# Patient Record
Sex: Female | Born: 1995 | Hispanic: No | Marital: Single | Smoking: Current every day smoker
Health system: Southern US, Community
[De-identification: ages and names within clinical notes are randomized; demographics above are authoritative.]

---

## 2017-04-06 DIAGNOSIS — D1621 Benign neoplasm of long bones of right lower limb: Secondary | ICD-10-CM | POA: Insufficient documentation

## 2017-08-15 ENCOUNTER — Encounter (HOSPITAL_COMMUNITY): Payer: Self-pay

## 2017-08-15 ENCOUNTER — Emergency Department (HOSPITAL_COMMUNITY)
Admission: EM | Admit: 2017-08-15 | Discharge: 2017-08-15 | Disposition: A | Discharge status: Home / Self Care | Payer: Medicaid Other | Attending: Emergency Medicine | Admitting: Emergency Medicine

## 2017-08-15 DIAGNOSIS — Z3201 Encounter for pregnancy test, result positive: Secondary | ICD-10-CM | POA: Insufficient documentation

## 2017-08-15 DIAGNOSIS — O99330 Smoking (tobacco) complicating pregnancy, unspecified trimester: Secondary | ICD-10-CM | POA: Diagnosis not present

## 2017-08-15 DIAGNOSIS — N898 Other specified noninflammatory disorders of vagina: Secondary | ICD-10-CM | POA: Diagnosis not present

## 2017-08-15 DIAGNOSIS — M545 Low back pain, unspecified: Secondary | ICD-10-CM

## 2017-08-15 DIAGNOSIS — F17210 Nicotine dependence, cigarettes, uncomplicated: Secondary | ICD-10-CM | POA: Insufficient documentation

## 2017-08-15 DIAGNOSIS — Z79899 Other long term (current) drug therapy: Secondary | ICD-10-CM | POA: Diagnosis not present

## 2017-08-15 DIAGNOSIS — Z3490 Encounter for supervision of normal pregnancy, unspecified, unspecified trimester: Secondary | ICD-10-CM

## 2017-08-15 DIAGNOSIS — O9989 Other specified diseases and conditions complicating pregnancy, childbirth and the puerperium: Secondary | ICD-10-CM | POA: Diagnosis not present

## 2017-08-15 DIAGNOSIS — Z3A00 Weeks of gestation of pregnancy not specified: Secondary | ICD-10-CM | POA: Diagnosis not present

## 2017-08-15 LAB — URINALYSIS, ROUTINE W REFLEX MICROSCOPIC
BILIRUBIN URINE: NEGATIVE
Bacteria, UA: NONE SEEN
Glucose, UA: NEGATIVE mg/dL
Hgb urine dipstick: NEGATIVE
KETONES UR: 80 mg/dL — AB
Nitrite: NEGATIVE
PH: 5 (ref 5.0–8.0)
PROTEIN: 30 mg/dL — AB
Specific Gravity, Urine: 1.035 — ABNORMAL HIGH (ref 1.005–1.030)

## 2017-08-15 LAB — I-STAT BETA HCG BLOOD, ED (MC, WL, AP ONLY)

## 2017-08-15 LAB — WET PREP, GENITAL
CLUE CELLS WET PREP: NONE SEEN
Sperm: NONE SEEN
Trich, Wet Prep: NONE SEEN
Yeast Wet Prep HPF POC: NONE SEEN

## 2017-08-15 LAB — PREGNANCY, URINE: Preg Test, Ur: POSITIVE — AB

## 2017-08-15 MED ORDER — ACETAMINOPHEN 325 MG PO TABS
650.0000 mg | ORAL_TABLET | Freq: Once | ORAL | Status: AC
  Administered 2017-08-15: 650 mg via ORAL
  Filled 2017-08-15: qty 2

## 2017-08-15 MED ORDER — SODIUM CHLORIDE 0.9 % IV BOLUS (SEPSIS)
1000.0000 mL | Freq: Once | INTRAVENOUS | Status: AC
  Administered 2017-08-15: 1000 mL via INTRAVENOUS

## 2017-08-15 MED ORDER — DEXTROSE 5 % IV SOLN
1.0000 g | Freq: Once | INTRAVENOUS | Status: AC
  Administered 2017-08-15: 1 g via INTRAVENOUS
  Filled 2017-08-15: qty 10

## 2017-08-15 MED ORDER — CEPHALEXIN 500 MG PO CAPS: 500.0000 mg | ORAL_CAPSULE | Freq: Two times a day (BID) | ORAL | 0 refills | Status: DC

## 2017-08-15 NOTE — Discharge Instructions (Signed)
Follow with OB/GYN as soon as possible. Do not hesitate to return to the emergency department for any new, worsening or concerning symptoms.  Do NOT take any NSAIDs, such as Aspirin, Motrin, Ibuprofen, Aleve, Naproxen etc. Only take Tylenol for pain. Return to the emergency room  for any severe abdominal pain, increasing vaginal bleeding, passing out or repeated vomiting.   Obtain over-the-counter prenatal vitamins. Read the label and make sure that they have at least 400 mcg of folate acid.  Please try to quit smoking.  If you develop vaginal bleeding, or abdominal pain go to women's hospital directly for evaluation.  Please go to the [facility] office in [facility] to apply for coverage. Alternatively, you can could go to the [place] office in [facility] to apply for emergency coverage.

## 2017-08-15 NOTE — ED Notes (Signed)
Pt called, no response

## 2017-08-15 NOTE — ED Triage Notes (Signed)
Patient c/o bilateral low back pain x 2 months. Patient reports frequent UTI's. Patient states she has been on antibiotics, but was never called regarding urine culture. Low back pain continues.

## 2017-08-15 NOTE — ED Provider Notes (Signed)
[place] DEPT Provider Note   CSN: [id number] Arrival date & time: 08/15/17  1805     History   Chief Complaint Chief Complaint  Patient presents with  . Back Pain  . Nausea    HPI   Blood pressure 116/63, pulse 71, temperature 98.3 F (36.8 C), temperature source Oral, resp. rate 16, height 5\' 6"  (1.676 m), weight 79.4 kg (175 lb), last menstrual period 07/13/2017, SpO2 99 %.  Miranda Fischer is a 21 y.o. female complaining of nonradiating low back pain which she's had over the course of 2 months. She denies any flank pain, fever, chills, nausea vomiting. She states that she's had intermittent urinary tract infections with urinary frequency and dysuria and she states she's been on 3 antibiotic courses with little relief. She's been getting her care out of urgent cares. She denies abdominal pain but endorses an abnormal vaginal discharge over the last 7 days with nausea and no emesis. She is 2 days late for her menses. Patient reports she's been taking ibuprofen at home with little relief. She rates the back pain at 8 out of 10, no exacerbating or alleviating factors identified. She denies any history of IV drug use or cancer. She states that the back pain has been becoming progressively worse over the course of 2 months. No acute change in symptoms recently.  History reviewed. No pertinent past medical history.  There are no active problems to display for this patient.   History reviewed. No pertinent surgical history.  OB History    No data available       Home Medications    Prior to Admission medications   Medication Sig Start Date End Date Taking? Authorizing Provider  ibuprofen (ADVIL,MOTRIN) 200 MG tablet Take 600 mg by mouth every 6 (six) hours as needed (back pain).   Yes [provider]  cephALEXin (KEFLEX) 500 MG capsule Take 1 capsule (500 mg total) by mouth 2 (two) times daily. 08/15/17   Daron Breeding, Elmyra Ricks, PA-C     Family History Family History  Problem Relation Age of Onset  . Family history unknown: Yes    Social History Social History  Substance Use Topics  . Smoking status: Current Every Day Smoker    Types: Cigars  . Smokeless tobacco: Never Used  . Alcohol use Yes     Comment: occasionally     Allergies   Penicillins   Review of Systems Review of Systems  A complete review of systems was obtained and all systems are negative except as noted in the HPI and PMH.   Physical Exam Updated Vital Signs BP 116/63 (BP Location: Right Arm)   Pulse 71   Temp 98.3 F (36.8 C) (Oral)   Resp 16   Ht 5\' 6"  (1.676 m)   Wt 79.4 kg (175 lb)   LMP 07/13/2017 (Approximate)   SpO2 99%   BMI 28.25 kg/m   Physical Exam  Constitutional: She is oriented to person, place, and time. She appears well-developed and well-nourished. No distress.  HENT:  Head: Normocephalic.  Mouth/Throat: Oropharynx is clear and moist.  Eyes: Conjunctivae and EOM are normal.  Neck: Normal range of motion.  Cardiovascular: Normal rate.   Pulmonary/Chest: Effort normal. No stridor.  Abdominal: Soft. Bowel sounds are normal. She exhibits no distension and no mass. There is no tenderness. There is no rebound and no guarding. No hernia.  Genitourinary:  Genitourinary Comments: No CVA tenderness to percussion bilaterally  Pelvic exam is chaperoned  by nurse: No rashes or lesions, there is a scant, white, opaque, homogenous, non-foul-smelling vaginal discharge with no cervical motion or adnexal tenderness.  Musculoskeletal: Normal range of motion.  Neurological: She is alert and oriented to person, place, and time.  Psychiatric: She has a normal mood and affect.  Nursing note and vitals reviewed.    ED Treatments / Results  Labs (all labs ordered are listed, but only abnormal results are displayed) Labs Reviewed  WET PREP, GENITAL - Abnormal; Notable for the following:       Result Value   WBC, Wet Prep  HPF POC MANY (*)    All other components within normal limits  URINALYSIS, ROUTINE W REFLEX MICROSCOPIC - Abnormal; Notable for the following:    Specific Gravity, Urine 1.035 (*)    Ketones, ur 80 (*)    Protein, ur 30 (*)    Leukocytes, UA TRACE (*)    Squamous Epithelial / LPF 0-5 (*)    All other components within normal limits  PREGNANCY, URINE - Abnormal; Notable for the following:    Preg Test, Ur POSITIVE (*)    All other components within normal limits  I-STAT BETA HCG BLOOD, ED ([facility], [facility], [facility] ONLY) - Abnormal; Notable for the following:    I-stat hCG, quantitative >2,000.0 (*)    All other components within normal limits  URINE CULTURE  RPR  HIV ANTIBODY (ROUTINE TESTING)  HCG, QUANTITATIVE, PREGNANCY  GC/CHLAMYDIA PROBE AMP ([place]) NOT AT [facility]    EKG  EKG Interpretation None       Radiology No results found.  Procedures Procedures (including critical care time)  Medications Ordered in ED Medications  cefTRIAXone (ROCEPHIN) 1 g in dextrose 5 % 50 mL IVPB (not administered)  acetaminophen (TYLENOL) tablet 650 mg (650 mg Oral Given 08/15/17 2058)  sodium chloride 0.9 % bolus 1,000 mL (1,000 mLs Intravenous New Bag/Given 08/15/17 2057)     Initial Impression / Assessment and Plan / ED Course  I have reviewed the triage vital signs and the nursing notes.  Pertinent labs & imaging results that were available during my care of the patient were reviewed by me and considered in my medical decision making (see chart for details).     Vitals:   08/15/17 1833 08/15/17 1835  BP: 116/63   Pulse: 71   Resp: 16   Temp: 98.3 F (36.8 C)   TempSrc: Oral   SpO2: 99%   Weight:  79.4 kg (175 lb)  Height:  5\' 6"  (1.676 m)    Medications  cefTRIAXone (ROCEPHIN) 1 g in dextrose 5 % 50 mL IVPB (not administered)  acetaminophen (TYLENOL) tablet 650 mg (650 mg Oral Given 08/15/17 2058)  sodium chloride 0.9 % bolus 1,000 mL (1,000 mLs Intravenous New Bag/Given  08/15/17 2057)    Miranda Fischer is 21 y.o. female presenting with low back pain, nausea and vaginal discharge. This pain has been indolent and worsening over the last 2 months. Urinalysis with leukocytes, not convincing for UTI. Urine pregnancy positive, she is 2 days late on her period. Abdominal exam is benign., Vital signs reassuring, doubt ectopic. Will confirm via i-STAT hCG. Urinalysis with greater than 80 ketones, will also give fluid bolus.  I stat hCG positive, pelvic exam with no significant abnormality, repeat abdominal exam remains completely benign. Advised patient on smoking cessation, will draw a quantitative for comparison in 48 hours, she understands she needs to go to [facility] hospital for recheck. Out of an abundance  of caution will treat UTI with Keflex. Patient given Rocephin in the ED. Advised her not to take any NSAIDs. Recommend Tylenol for chronic back pain.  Evaluation does not show pathology that would require ongoing emergent intervention or inpatient treatment. Pt is hemodynamically stable and mentating appropriately. Discussed findings and plan with patient/guardian, who agrees with care plan. All questions answered. Return precautions discussed and outpatient follow up given.    Final Clinical Impressions(s) / ED Diagnoses   Final diagnoses:  Midline low back pain without sciatica, unspecified chronicity  Pregnancy, unspecified gestational age    New Prescriptions New Prescriptions   CEPHALEXIN (KEFLEX) 500 MG CAPSULE    Take 1 capsule (500 mg total) by mouth 2 (two) times daily.     Waynetta Pean 08/15/17 2246    Dorie Rank, MD 08/16/17 [phone]

## 2017-08-16 LAB — HIV ANTIBODY (ROUTINE TESTING W REFLEX): HIV Screen 4th Generation wRfx: NONREACTIVE

## 2017-08-16 LAB — HCG, QUANTITATIVE, PREGNANCY: HCG, BETA CHAIN, QUANT, S: 21151 m[IU]/mL — AB (ref <5)

## 2017-08-17 LAB — URINE CULTURE: Culture: NO GROWTH

## 2017-08-17 LAB — GC/CHLAMYDIA PROBE AMP (~~LOC~~) NOT AT ARMC
CHLAMYDIA, DNA PROBE: POSITIVE — AB
NEISSERIA GONORRHEA: NEGATIVE

## 2017-08-18 ENCOUNTER — Telehealth: Payer: Self-pay | Admitting: *Deleted

## 2017-08-18 NOTE — Telephone Encounter (Signed)
CM Looked through chart and could find no record of Rx having been called to Pharmacy of choice. [facility] @ [place] @ [phone]. CM called and confirmed with Hillard Danker, MD Rx to be called which is Azithromycin 1 Gm po X 1. Called to Pharmacy. Made pt aware of above and that she could pick up Rx. Encouraged her to follow up with OB GYN. Made her aware of Health Dept services for her partner and need to practice safe sex until partner(s) treated.

## 2017-08-19 LAB — RPR: RPR Ser Ql: NONREACTIVE

## 2017-08-20 ENCOUNTER — Telehealth: Payer: Self-pay | Admitting: Medical

## 2017-08-20 DIAGNOSIS — A749 Chlamydial infection, unspecified: Secondary | ICD-10-CM

## 2017-08-20 MED ORDER — AZITHROMYCIN 250 MG PO TABS: 1000.0000 mg | ORAL_TABLET | Freq: Once | ORAL | 0 refills | Status: AC

## 2017-08-20 NOTE — Telephone Encounter (Addendum)
Miranda Fischer tested positive for  Chlamydia. Patient was called by RN and allergies and pharmacy confirmed. Rx sent to pharmacy of choice.   Danielle Rankin 08/20/2017 9:39 AM      ----- Message from Bjorn Loser, RN sent at 08/17/2017  3:43 PM EDT ----- This patient tested positive for: Chlamydia  She is allergic to "Penicillin"  I have informed the patient of her results and confirmed her pharmacy is correct in her chart. Please send Rx.   Thank you,   Bjorn Loser, RN   Results faxed to [facility] Department.

## 2019-02-04 ENCOUNTER — Ambulatory Visit (HOSPITAL_COMMUNITY)
Admission: EM | Admit: 2019-02-04 | Discharge: 2019-02-04 | Disposition: A | Discharge status: Home / Self Care | Payer: Medicaid Other | Attending: Family Medicine | Admitting: Family Medicine

## 2019-02-04 ENCOUNTER — Encounter (HOSPITAL_COMMUNITY): Payer: Self-pay | Admitting: Emergency Medicine

## 2019-02-04 ENCOUNTER — Other Ambulatory Visit: Payer: Self-pay

## 2019-02-04 DIAGNOSIS — Z113 Encounter for screening for infections with a predominantly sexual mode of transmission: Secondary | ICD-10-CM | POA: Diagnosis not present

## 2019-02-04 DIAGNOSIS — Z202 Contact with and (suspected) exposure to infections with a predominantly sexual mode of transmission: Secondary | ICD-10-CM | POA: Diagnosis not present

## 2019-02-04 DIAGNOSIS — Z7253 High risk bisexual behavior: Secondary | ICD-10-CM | POA: Diagnosis not present

## 2019-02-04 DIAGNOSIS — Z3202 Encounter for pregnancy test, result negative: Secondary | ICD-10-CM

## 2019-02-04 LAB — POCT PREGNANCY, URINE: Preg Test, Ur: NEGATIVE

## 2019-02-04 LAB — POCT URINALYSIS DIP (DEVICE)
Bilirubin Urine: NEGATIVE
Glucose, UA: NEGATIVE mg/dL
Hgb urine dipstick: NEGATIVE
Ketones, ur: NEGATIVE mg/dL
Leukocytes,Ua: NEGATIVE
Nitrite: NEGATIVE
Protein, ur: NEGATIVE mg/dL
Specific Gravity, Urine: 1.02 (ref 1.005–1.030)
Urobilinogen, UA: 0.2 mg/dL (ref 0.0–1.0)
pH: 7 (ref 5.0–8.0)

## 2019-02-04 MED ORDER — AZITHROMYCIN 250 MG PO TABS
ORAL_TABLET | ORAL | Status: AC
  Filled 2019-02-04: qty 4

## 2019-02-04 MED ORDER — AZITHROMYCIN 250 MG PO TABS
1000.0000 mg | ORAL_TABLET | Freq: Once | ORAL | Status: AC
  Administered 2019-02-04: 14:00:00 1000 mg via ORAL

## 2019-02-04 NOTE — ED Triage Notes (Signed)
"  one of my sex partners " has a cold sore on his lip.   Denies vaginal discharge Denies any sores

## 2019-02-04 NOTE — ED Provider Notes (Signed)
[place]    CSN: [id number] Arrival date & time: 02/04/19  1248     History   Chief Complaint Chief Complaint  Patient presents with  . SEXUALLY TRANSMITTED DISEASE    HPI Miranda Fischer is a 23 y.o. adult.   HPI  Patient presents with concern for STD. Patient is self-identifies as a female. Patient endorses bi-sexual behaviors and recently had a sexual encounter with female partner. The female partner recently contact her to information that he "thinks he has an STD". Patient reports specifically that her sexual partner reported that he had a "new lesion" that developed on his lip. She specifically requests a complete STD work-up including HSV. She has a penicillin allergy with hives three years ago and is afraid to receive penicillin or penicillin related medications. She is asymptomatic. Last sexual encounter with partner was 1.5 weeks ago. She is currently taking chronic metronidazole for suppression treatment of chronic bacterial vaginosis. OB History   No obstetric history on file.      Home Medications    Prior to Admission medications   Medication Sig Start Date End Date Taking? Authorizing Provider  METRONIDAZOLE PO Take by mouth.   Yes [provider]  NON FORMULARY    Yes [provider]  cephALEXin (KEFLEX) 500 MG capsule Take 1 capsule (500 mg total) by mouth 2 (two) times daily. 08/15/17   Pisciotta, Elmyra Ricks, PA-C  ibuprofen (ADVIL,MOTRIN) 200 MG tablet Take 600 mg by mouth every 6 (six) hours as needed (back pain).    [provider]    Family History Family History  Family history unknown: Yes    Social History Social History   Tobacco Use  . Smoking status: Never Smoker  . Smokeless tobacco: Never Used  Substance Use Topics  . Alcohol use: Yes    Comment: occasionally  . Drug use: Yes    Types: Marijuana    Comment: socially     Allergies   Penicillins   Review of Systems Review of Systems Pertinent  negatives listed in HPI  Physical Exam Triage Vital Signs ED Triage Vitals  Enc Vitals Group     BP 02/04/19 1301 117/72     Pulse Rate 02/04/19 1301 68     Resp 02/04/19 1301 18     Temp 02/04/19 1301 98.6 F (37 C)     Temp Source 02/04/19 1301 Oral     SpO2 02/04/19 1301 98 %     Weight --      Height --      Head Circumference --      Peak Flow --      Pain Score 02/04/19 1257 0     Pain Loc --      Pain Edu? --      Excl. in [place]? --    No data found.  Updated Vital Signs BP 117/72 (BP Location: Left Arm)   Pulse 68   Temp 98.6 F (37 C) (Oral)   Resp 18   LMP 01/29/2019   SpO2 98%   Visual Acuity Right Eye Distance:   Left Eye Distance:   Bilateral Distance:    Right Eye Near:   Left Eye Near:    Bilateral Near:     Physical Exam General appearance: alert, well developed, well nourished, cooperative and in no distress Head: Normocephalic, without obvious abnormality, atraumatic Respiratory: Respirations even and unlabored, normal respiratory rate Heart: rate and rhythm normal. No gallop or murmurs noted on exam  Extremities: No gross deformities Skin: Skin color, texture, turgor normal. No rashes seen  Psych: Appropriate mood and affect. Neurologic: Mental status: Alert, oriented to person, place, and time, thought content appropriate.  UC Treatments / Results  Labs (all labs ordered are listed, but only abnormal results are displayed) Labs Reviewed - No data to display  EKG None  Radiology No results found.  Procedures Procedures (including critical care time)  Medications Ordered in UC Medications - No data to display  Initial Impression / Assessment and Plan / UC Course  I have reviewed the triage vital signs and the nursing notes.  Pertinent labs & imaging results that were available during my care of the patient were reviewed by me and considered in my medical decision making (see chart for details).  Concern for STD without known  exposure. Patient agreed to prophylactic treatment with Azithromycin , however deferred treatment for Gonorrhea given her penicillin allergy. She was advised that she will need to return for treatment if Gonorrhea is positive. Patient also requested HSV screening along with routine STD tests. Advised to refrain from sexual contact until labs have resulted.    Final Clinical Impressions(s) / UC Diagnoses   Final diagnoses:  Screen for STD (sexually transmitted disease)     Discharge Instructions     You will be notified of your lab results.  If gonorrhea is positive, you will have to return for alternative treatment.  You were treated for possible exposure to Chlamydia with Azithromycin.  All labs will result in 7-10 days. You will ne notified via phone. Avoid sexual contact until results are known.      ED Prescriptions    None     Controlled Substance Prescriptions [place] Controlled Substance Registry consulted? Not Applicable   Scot Jun, [employer] 02/04/19 1414

## 2019-02-04 NOTE — Discharge Instructions (Addendum)
You will be notified of your lab results.  If gonorrhea is positive, you will have to return for alternative treatment.  You were treated for possible exposure to Chlamydia with Azithromycin.  All labs will result in 7-10 days. You will ne notified via phone. Avoid sexual contact until results are known.

## 2019-02-05 LAB — HSV 2 ANTIBODY, IGG: HSV 2 Glycoprotein G Ab, IgG: <0.91 index (ref 0.00–0.90)

## 2019-02-05 LAB — CERVICOVAGINAL ANCILLARY ONLY
Bacterial vaginitis: POSITIVE — AB
Candida vaginitis: NEGATIVE
Chlamydia: NEGATIVE
Neisseria Gonorrhea: NEGATIVE
Trichomonas: NEGATIVE

## 2019-02-05 LAB — RPR: RPR Ser Ql: NONREACTIVE

## 2019-02-05 LAB — HIV ANTIBODY (ROUTINE TESTING W REFLEX): HIV Screen 4th Generation wRfx: NONREACTIVE

## 2019-02-05 LAB — HSV 1 ANTIBODY, IGG: HSV 1 Glycoprotein G Ab, IgG: <0.91 index (ref 0.00–0.90)

## 2019-02-06 ENCOUNTER — Telehealth (HOSPITAL_COMMUNITY): Payer: Self-pay | Admitting: Emergency Medicine

## 2019-02-06 MED ORDER — METRONIDAZOLE 500 MG PO TABS: 500.0000 mg | ORAL_TABLET | Freq: Two times a day (BID) | ORAL | 0 refills | Status: AC

## 2019-02-06 NOTE — Telephone Encounter (Signed)
Bacterial vaginosis is positive. This was not treated at the urgent care visit.  Flagyl 500 mg BID x 7 days #14 no refills sent to patients pharmacy of choice.    Patient contacted and made aware of all results, all questions answered.

## 2019-07-08 ENCOUNTER — Encounter (HOSPITAL_COMMUNITY): Payer: Self-pay | Admitting: Emergency Medicine

## 2019-07-08 ENCOUNTER — Other Ambulatory Visit: Payer: Self-pay

## 2019-07-08 ENCOUNTER — Ambulatory Visit (HOSPITAL_COMMUNITY)
Admission: EM | Admit: 2019-07-08 | Discharge: 2019-07-08 | Disposition: A | Discharge status: Home / Self Care | Payer: Medicaid Other | Attending: Family Medicine | Admitting: Family Medicine

## 2019-07-08 DIAGNOSIS — Z202 Contact with and (suspected) exposure to infections with a predominantly sexual mode of transmission: Secondary | ICD-10-CM | POA: Diagnosis not present

## 2019-07-08 NOTE — Discharge Instructions (Addendum)
Sending your swab for testing we will call you with any positive results.  Keep taking your Flagyl as prescribed. Follow up as needed for continued or worsening symptoms

## 2019-07-08 NOTE — ED Provider Notes (Signed)
[place]    CSN: [id number] Arrival date & time: 07/08/19  0845      History   Chief Complaint Chief Complaint  Patient presents with  . SEXUALLY TRANSMITTED DISEASE    HPI Miranda Fischer is a 23 y.o. female.   Patient is a 23 year old female the presents today for STD testing.  Reporting that she was exposed to trichomonas.  Patient is currently taking metronidazole 3 times a week for chronic BV.  Denies any change in discharge or abdominal pain.  No dysuria, hematuria or urinary frequency. Patient's last menstrual period was 07/01/2019.   ROS per HPI      History reviewed. No pertinent past medical history.  There are no active problems to display for this patient.   History reviewed. No pertinent surgical history.  OB History   No obstetric history on file.      Home Medications    Prior to Admission medications   Medication Sig Start Date End Date Taking? Authorizing Provider  metroNIDAZOLE (FLAGYL) 500 MG tablet TK 1 T PO 3 TIMES Q WK 06/16/19  Yes [provider]    Family History No family history on file.  Social History Social History   Tobacco Use  . Smoking status: Not on file  Substance Use Topics  . Alcohol use: Not on file  . Drug use: Not on file     Allergies   Penicillins and Shellfish allergy   Review of Systems Review of Systems   Physical Exam Triage Vital Signs ED Triage Vitals  Enc Vitals Group     BP 07/08/19 0933 102/68     Pulse Rate 07/08/19 0933 82     Resp 07/08/19 0933 18     Temp 07/08/19 0933 98.3 F (36.8 C)     Temp Source 07/08/19 0933 Oral     SpO2 07/08/19 0933 97 %     Weight --      Height --      Head Circumference --      Peak Flow --      Pain Score 07/08/19 0934 0     Pain Loc --      Pain Edu? --      Excl. in [place]? --    No data found.  Updated Vital Signs BP 102/68   Pulse 82   Temp 98.3 F (36.8 C) (Oral)   Resp 18   LMP 07/01/2019   SpO2 97%   Visual  Acuity Right Eye Distance:   Left Eye Distance:   Bilateral Distance:    Right Eye Near:   Left Eye Near:    Bilateral Near:     Physical Exam Vitals signs and nursing note reviewed.  Constitutional:      General: She is not in acute distress.    Appearance: Normal appearance. She is not ill-appearing, toxic-appearing or diaphoretic.  HENT:     Head: Normocephalic.     Nose: Nose normal.     Mouth/Throat:     Pharynx: Oropharynx is clear.  Eyes:     Conjunctiva/sclera: Conjunctivae normal.  Neck:     Musculoskeletal: Normal range of motion.  Pulmonary:     Effort: Pulmonary effort is normal.  Abdominal:     Palpations: Abdomen is soft.     Tenderness: There is no abdominal tenderness.  Musculoskeletal: Normal range of motion.  Skin:    General: Skin is warm and dry.     Findings: No rash.  Neurological:     Mental Status: She is alert.  Psychiatric:        Mood and Affect: Mood normal.      UC Treatments / Results  Labs (all labs ordered are listed, but only abnormal results are displayed) Labs Reviewed  CERVICOVAGINAL ANCILLARY ONLY    EKG   Radiology No results found.  Procedures Procedures (including critical care time)  Medications Ordered in UC Medications - No data to display  Initial Impression / Assessment and Plan / UC Course  I have reviewed the triage vital signs and the nursing notes.  Pertinent labs & imaging results that were available during my care of the patient were reviewed by me and considered in my medical decision making (see chart for details).     Testing for trichomonas, gonorrhea and chlamydia based on STD exposure. Patient already currently taking Flagyl for BV Labs pending Final Clinical Impressions(s) / UC Diagnoses   Final diagnoses:  STD exposure     Discharge Instructions     Sending your swab for testing we will call you with any positive results.  Keep taking your Flagyl as prescribed. Follow up as needed  for continued or worsening symptoms     ED Prescriptions    None     Controlled Substance Prescriptions [place] Controlled Substance Registry consulted? No   Orvan July, NP 07/08/19 1001

## 2019-07-08 NOTE — ED Triage Notes (Signed)
Pt here to day for STD testing. Expose to trich. On med for chronic BV.

## 2019-07-09 LAB — CERVICOVAGINAL ANCILLARY ONLY
Chlamydia: NEGATIVE
Neisseria Gonorrhea: NEGATIVE
Trichomonas: NEGATIVE

## 2019-11-11 ENCOUNTER — Ambulatory Visit: Payer: Medicaid Other | Admitting: Family Medicine

## 2019-11-11 ENCOUNTER — Ambulatory Visit (INDEPENDENT_AMBULATORY_CARE_PROVIDER_SITE_OTHER): Payer: Medicaid Other | Admitting: Family Medicine

## 2019-11-11 ENCOUNTER — Other Ambulatory Visit: Payer: Self-pay

## 2019-11-11 VITALS — BP 115/80 | HR 78 | Wt 204.8 lb

## 2019-11-11 DIAGNOSIS — N760 Acute vaginitis: Secondary | ICD-10-CM | POA: Diagnosis not present

## 2019-11-11 DIAGNOSIS — R7989 Other specified abnormal findings of blood chemistry: Secondary | ICD-10-CM | POA: Diagnosis not present

## 2019-11-11 DIAGNOSIS — B9689 Other specified bacterial agents as the cause of diseases classified elsewhere: Secondary | ICD-10-CM | POA: Diagnosis not present

## 2019-11-11 NOTE — Progress Notes (Signed)
   Subjective:    Patient ID: Miranda Fischer, adult    DOB: 04-03-1996, 24 y.o.   MRN: [id number]   CC: Miranda Fischer is a 24 yr old female who presents to the clinic today for a new PCP visit   HPI:  Bacterial vaginosis Patient upset today as she is fed up with having chronic BV last for 3 years. Denies having discharge coming out of the vagina but reports "clumps of white discharge which stays inside". Foul smelling.  Finished a 6-month course of prophylactic Flagyl and Flagyl gel and then her.  Started last week .  Reports also taking probiotics and boric acid in the past.  None of these medications have proven helpful and patient is very frustrated and tearful.  Birth control Currently taking birth control: Lo loestrin with iron which her PCP recommended starting 2 months ago ago because her " hormones are out of control" in order to help her bacterial vaginosis symptoms.  Patient is not taking the pill for birth control.  Is only sexually active with females. LMP 3rd January.   Abnormal TSH in December 2020 Patient is very worried about her TSH result taken at previous PCP.  Was told it was abnormal she claims doctor did not explained the results to her properly.  Patient pulled out results on her phone:TSH 0.365.  I explained that we would repeat the TSH today  PMH: Chronic BV  FH: Patient is from foster care, unaware of birth parents family history  SH: Smokes weed and cigarettes (2 a day) since age 23.  Denies intravenous drugs or EtOH.  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   Smoking status reviewed  ROS: pertinent noted in the HPI   Past medical history, surgical, family, and social history reviewed and updated in the EMR as appropriate. Reviewed problem list.   Objective:  BP 115/80   Pulse 78   Wt 204 lb 12.8 oz (92.9 kg)   BMI 33.06 kg/m   Vitals and nursing note reviewed  General: NAD, pleasant, able to  participate in exam Cardiac: RRR, S1 S2 present. normal heart sounds, no murmurs. Respiratory: CTAB, normal effort, No wheezes, rales or rhonchi Extremities: no edema or cyanosis. Skin: warm and dry, no rashes noted Neuro: alert, no obvious focal deficits Psych: Normal affect and tearful at times   Assessment & Plan:    Bacterial vaginosis Patient has recurrent bacterial vaginosis with poor response to multiple treatments.  Recommended patient try boric acid which she can buy on [place] and that I can refer her to GYN.  Patient would like for referral to Gyn.   Abnormal TSH No known thyroid disease history. Repeat TSH today.   Lattie Haw, MD  [facility] PGY-1

## 2019-11-11 NOTE — Assessment & Plan Note (Signed)
Patient has recurrent bacterial vaginosis with poor response to multiple treatments.  Recommended patient try boric acid which she can buy on [place] and that I can refer her to GYN.  Patient would like for referral to Gyn.

## 2019-11-11 NOTE — Assessment & Plan Note (Signed)
No known thyroid disease history. Repeat TSH today.

## 2019-11-11 NOTE — Patient Instructions (Signed)
Ms Insinga,  It was lovely to meet you today! I am happy to be your new PCP. I have referred you to Gynecology for your recurrent BV. And we will check your thyroid levels today. I will be in touch with the result. Please download MyChart. Please book a virtual visit with me at your convenience.   Kind regards,  Dr Lattie Haw MD

## 2019-11-12 LAB — TSH: TSH: 0.422 u[IU]/mL — ABNORMAL LOW (ref 0.450–4.500)

## 2019-11-13 ENCOUNTER — Telehealth: Payer: Self-pay | Admitting: Family Medicine

## 2019-11-13 NOTE — Telephone Encounter (Signed)
Please could you inform the patient that we do not prescribe boric acid suppositories and she should purchase these from [facility] as we discussed. Thank you.

## 2019-11-13 NOTE — Telephone Encounter (Signed)
Patient requesting prescription for boric acid be sent to [facility]. Please call patient with any questions.

## 2019-11-14 ENCOUNTER — Other Ambulatory Visit: Payer: Medicaid Other

## 2019-11-14 ENCOUNTER — Telehealth: Payer: Self-pay | Admitting: Family Medicine

## 2019-11-14 ENCOUNTER — Other Ambulatory Visit: Payer: Self-pay

## 2019-11-14 DIAGNOSIS — R7989 Other specified abnormal findings of blood chemistry: Secondary | ICD-10-CM

## 2019-11-14 NOTE — Telephone Encounter (Signed)
Patient returning Elizabeth phone called. Told patient she would need an appt with lab to get these further thyroid function tests done. Patient then said 'what about the boric acid I called about yesterday?" I went to that phone note and also read that message off to patient. Patient states 'so y'all are denying me the medication?" I went back to the appt notes from 11/11/2019 as well and told patient Dr. Posey Pronto discuss with the patient she would have to get the boric acid from [facility] at this appt. Patient then just said 'no, I need to talk to Dr. Posey Pronto so have her call me.'  Offered patient lab visit and patient says 'no I will schedule that when [facility] calls me back."

## 2019-11-14 NOTE — Telephone Encounter (Signed)
Called pt to inform her of her TSH result. Went to VM so left message for her to come in and get further thyroid function tests done. I will order these. Please call pt to come in for these labs. Thanks

## 2019-11-14 NOTE — Telephone Encounter (Signed)
Called pt again to inform her that we cannot prescribe boric acid and she should purchase this from [facility]. Also recommended that she comes in for labs next week to check t4 and t3 as her TSH low. Pt said she came into for them after I left her VM. She said she would like virtual visit next week to discuss these results. I asked her to call the front desk to make appointment at her convenience.

## 2019-11-15 LAB — T4, FREE: Free T4: 0.98 ng/dL (ref 0.82–1.77)

## 2019-11-15 LAB — T3, FREE: T3, Free: 3.4 pg/mL (ref 2.0–4.4)

## 2019-11-17 ENCOUNTER — Telehealth: Payer: Self-pay | Admitting: Family Medicine

## 2019-11-17 NOTE — Telephone Encounter (Signed)
Informed pt of normal t3 t4 and let her know she will need repeat TSH in 4-6 weeks as it is slightly low. Counseled pt on hypo/hyperthyroid sx.

## 2019-12-10 ENCOUNTER — Telehealth (INDEPENDENT_AMBULATORY_CARE_PROVIDER_SITE_OTHER): Payer: Medicaid Other | Admitting: Family Medicine

## 2019-12-10 ENCOUNTER — Other Ambulatory Visit: Payer: Self-pay

## 2019-12-10 DIAGNOSIS — N760 Acute vaginitis: Secondary | ICD-10-CM | POA: Diagnosis present

## 2019-12-10 DIAGNOSIS — B9689 Other specified bacterial agents as the cause of diseases classified elsewhere: Secondary | ICD-10-CM | POA: Diagnosis not present

## 2019-12-10 DIAGNOSIS — F489 Nonpsychotic mental disorder, unspecified: Secondary | ICD-10-CM | POA: Diagnosis not present

## 2019-12-10 DIAGNOSIS — Z3009 Encounter for other general counseling and advice on contraception: Secondary | ICD-10-CM

## 2019-12-10 DIAGNOSIS — Z9189 Other specified personal risk factors, not elsewhere classified: Secondary | ICD-10-CM | POA: Diagnosis not present

## 2019-12-13 DIAGNOSIS — Z9189 Other specified personal risk factors, not elsewhere classified: Secondary | ICD-10-CM | POA: Insufficient documentation

## 2019-12-13 DIAGNOSIS — Z3009 Encounter for other general counseling and advice on contraception: Secondary | ICD-10-CM | POA: Insufficient documentation

## 2019-12-13 DIAGNOSIS — F489 Nonpsychotic mental disorder, unspecified: Secondary | ICD-10-CM | POA: Insufficient documentation

## 2019-12-13 NOTE — Assessment & Plan Note (Signed)
Unable to determine exactly the dosage of the birth control pill the patient is taking. She said she will contact the previous family medicine clinic with the exact dose and call our clinic to let [place] know.  I will then prescribe the birth control pills.

## 2019-12-13 NOTE — Assessment & Plan Note (Addendum)
Plan to do wet mount at next visit.  We will rerefer her to GYN if she has not heard from them in the next 1 to 2 weeks.

## 2019-12-13 NOTE — Assessment & Plan Note (Signed)
Pt is has life stressors which are impacting her which she would like to discuss. At low risk of self harm and suicide. Has protective factors which include her children. She is happy to discuss this in greater detail at my next appointment with her in a few weeks where we can consider referral to therapy if needed.

## 2019-12-13 NOTE — Assessment & Plan Note (Addendum)
We will check iron panel at next visit and consider starting on iron supplements if iron levels are low.

## 2019-12-13 NOTE — Progress Notes (Signed)
[facility] Telemedicine Visit  Patient consented to have virtual visit. Method of visit: telephone   Encounter participants: Patient: Miranda Fischer - located at home Provider: Lattie Haw - located at [facility] Others (if applicable): none   Chief Complaint: Patient would like to discuss multiple things today: Birth control, anemia, bacterial vaginosis and mental health issues.  HPI:  Anemia Patient was previously diagnosed with iron deficiency anemia when she was at her previous family practice.  Was trying to read me her previous iron panel results but difficult to comprehend over telephone.  She is currently taking birth control pill which contains iron in it.  Has regular periods and does not have menorrhagia.  Denies dizziness or pallor.  Does feel tired sometimes  Birth control Patient currently taking "Lo estrogen fe" to regulate periods/birth control. At previous visit patient wwas sexually active with females only.  At this visit patient explains that she has started having sex with males again.  She currently has 1 sexual partner who she is having protected intercourse with.  Would like refill on birth control.  Unable to tell me the dose of the pill she is taking.  Bacterial vaginosis Patient still has not heard from Gyn referral regarding bacterial vaginosis.  Denies current symptoms of BV but is worried she is going to get it again.  Has been taking boric acid which he purchased online.  Mental health issues Patient feels agitated, emotional and sometimes low in mood especially during the Covid times and would like to talk to someone about this.  Denies suicidal ideation, thoughts of self-harm or harming her children.   ROS: per HPI  Pertinent PMHx: Bacterial vaginosis, hypothyroidism  Exam:   Respiratory: Speaking in full sentences  Assessment/Plan:  Bacterial vaginosis Plan to do wet mount at next visit.  We will rerefer her to GYN if  she has not heard from them in the next 1 to 2 weeks.  At risk for anemia We will check iron panel at next visit and consider starting on iron supplements if iron levels are low.  Birth control counseling Unable to determine exactly the dosage of the birth control pill the patient is taking. She said she will contact the previous family medicine clinic with the exact dose and call our clinic to let [place] know.  I will then prescribe the birth control pills.   Mental health problem Pt is has life stressors which are impacting her which she would like to discuss. At low risk of self harm and suicide. Has protective factors which include her children. She is happy to discuss this in greater detail at my next appointment with her in a few weeks where we can consider referral to therapy if needed.    Time spent during visit with patient: 25 minutes

## 2019-12-30 ENCOUNTER — Ambulatory Visit: Payer: Medicaid Other | Admitting: Family Medicine

## 2019-12-31 ENCOUNTER — Other Ambulatory Visit (HOSPITAL_COMMUNITY)
Admission: RE | Admit: 2019-12-31 | Discharge: 2019-12-31 | Disposition: A | Discharge status: Home / Self Care | Payer: Medicaid Other | Source: Ambulatory Visit | Attending: Family Medicine | Admitting: Family Medicine

## 2019-12-31 ENCOUNTER — Ambulatory Visit (INDEPENDENT_AMBULATORY_CARE_PROVIDER_SITE_OTHER): Payer: Medicaid Other | Admitting: Family Medicine

## 2019-12-31 ENCOUNTER — Other Ambulatory Visit: Payer: Self-pay

## 2019-12-31 VITALS — BP 110/62 | HR 73 | Wt 195.0 lb

## 2019-12-31 DIAGNOSIS — Z309 Encounter for contraceptive management, unspecified: Secondary | ICD-10-CM | POA: Diagnosis not present

## 2019-12-31 DIAGNOSIS — Z124 Encounter for screening for malignant neoplasm of cervix: Secondary | ICD-10-CM | POA: Diagnosis not present

## 2019-12-31 DIAGNOSIS — N760 Acute vaginitis: Secondary | ICD-10-CM | POA: Diagnosis not present

## 2019-12-31 DIAGNOSIS — Z113 Encounter for screening for infections with a predominantly sexual mode of transmission: Secondary | ICD-10-CM | POA: Insufficient documentation

## 2019-12-31 DIAGNOSIS — Z3009 Encounter for other general counseling and advice on contraception: Secondary | ICD-10-CM

## 2019-12-31 DIAGNOSIS — B9689 Other specified bacterial agents as the cause of diseases classified elsewhere: Secondary | ICD-10-CM | POA: Diagnosis not present

## 2019-12-31 DIAGNOSIS — R7989 Other specified abnormal findings of blood chemistry: Secondary | ICD-10-CM

## 2019-12-31 DIAGNOSIS — Z9189 Other specified personal risk factors, not elsewhere classified: Secondary | ICD-10-CM

## 2019-12-31 LAB — POCT WET PREP (WET MOUNT)
Clue Cells Wet Prep Whiff POC: NEGATIVE
Trichomonas Wet Prep HPF POC: ABSENT
WBC, Wet Prep HPF POC: >20

## 2019-12-31 LAB — POCT URINE PREGNANCY: Preg Test, Ur: NEGATIVE

## 2019-12-31 MED ORDER — NORETHIN ACE-ETH ESTRAD-FE 1-20 MG-MCG(24) PO TABS: 1.0000 | ORAL_TABLET | Freq: Every day | ORAL | 3 refills | Status: DC

## 2019-12-31 MED ORDER — NORETHIN ACE-ETH ESTRAD-FE 1-20 MG-MCG(24) PO TABS: 1.0000 | ORAL_TABLET | Freq: Every day | ORAL | 11 refills | Status: DC

## 2019-12-31 NOTE — Assessment & Plan Note (Addendum)
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  Discussed the next time she is symptomatic to come in and be tested, especially as her vaginal complaints sound more typical of Candida.  If indeed she does have BV, recommend discussing MetroGel at home for symptomatic therapy to avoid recurrent visits. Also discussed a healthy balanced diet, appropriate hydration, avoiding tight clothing/thong underwear, and sensitive soaps if needed.

## 2019-12-31 NOTE — Progress Notes (Signed)
    SUBJECTIVE:   CHIEF COMPLAINT / HPI: STD screening/birth control counseling  Miranda Fischer is a 24 year old female presenting to discuss the following:  STD screening/bacterial vaginosis: Would like to be screened due to history of bacterial vaginosis for the past several years and new sexual partners in the last several months.  She used to be on metronidazole for an entire year as prophylactic therapy for BV, has not actually been tested for BV in quite some time, has just assumed that whenever her vaginal discharge changes that it was secondary to this.  Generally when she does have symptoms she reports white, chunky vaginal discharge without odor.  She currently is sexually active with women only at this time, however recently with a female as well, use protection.  She denies having any vaginal discharge, dyspareunia, dysuria, vaginal itching/irritation.  Birth control: Taking Loestrogen-fe, ran out 2 weeks ago, she would like to continue using this.  Not interested in an IUD/Nexplanon/patches/NuvaRing.  Reliably takes it every day at the same time.  Currently just completing her menstrual cycle.  Last sexually active with a female 1.5 months ago.   PERTINENT  PMH / PSH: osteoid osteoma of femur, BV, iron deficiency anemia, abnormal TSH  OBJECTIVE:   BP 110/62   Pulse 73   Wt 195 lb (88.5 kg)   SpO2 98%   BMI 31.47 kg/m   General: Alert, NAD HEENT: NCAT, MMM, oropharynx nonerythematous  Lungs: Unlabored breathing Abdomen: soft Msk: Moves all extremities spontaneously  Ext: Warm, dry Pelvic: Pelvic exam: normal external genitalia, vulva, vagina, cervix, uterus and adnexa, VULVA: normal appearing vulva with no masses, tenderness or lesions, VAGINA: normal appearing vagina with normal color and discharge, no lesions, CERVIX: normal appearing cervix without discharge or lesions, UTERUS: uterus is normal size, shape, consistency and nontender.  Pap, GC/ch, and wet prep collected.  Chaperoned  by CMA.  ASSESSMENT/PLAN:   Screening examination for STD (sexually transmitted disease) Screening only, asymptomatic.  Will check gc/ch, wet prep, and HIV/RPR.  Encouraged condom use when sexually active with men, precautionary measures with females.  Birth control counseling U preg negative, and otherwise reliably not pregnant. Will send in OCPs.  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  Discussed the next time she is symptomatic to come in and be tested, especially as her vaginal complaints sound more typical of Candida.  If indeed she does have BV, recommend discussing MetroGel at home for symptomatic therapy to avoid recurrent visits. Also discussed a healthy balanced diet, appropriate hydration, avoiding tight clothing/thong underwear, and sensitive soaps if needed.   Screening for cervical cancer Obtained Pap smear today during STD screening, reports she had a Pap in 2019 that was "invalid" and never had it repeated.  Abnormal TSH Will repeat today per PCP request.  At risk for anemia Will collect CBC and iron panel due to PCP request from recent telemedicine visit.    Follow-up with PCP as scheduled or development of vaginal symptoms.  Patriciaann Clan, [place]

## 2019-12-31 NOTE — Patient Instructions (Signed)
It was wonderful meeting you today.  For your history of bacterial vaginosis, we will have you come back the next time you have symptoms, if it is indeed BV then we can discuss metrogel at home.   I will let you know the results when they return in the next few days. Please call if you have any questions or concerns.

## 2019-12-31 NOTE — Assessment & Plan Note (Signed)
Screening only, asymptomatic.  Will check gc/ch, wet prep, and HIV/RPR.  Encouraged condom use when sexually active with men, precautionary measures with females.

## 2019-12-31 NOTE — Assessment & Plan Note (Signed)
Will repeat today per PCP request.

## 2019-12-31 NOTE — Assessment & Plan Note (Addendum)
U preg negative, and otherwise reliably not pregnant. Will send in OCPs.

## 2019-12-31 NOTE — Assessment & Plan Note (Signed)
Obtained Pap smear today during STD screening, reports she had a Pap in 2019 that was "invalid" and never had it repeated.

## 2019-12-31 NOTE — Assessment & Plan Note (Signed)
Will collect CBC and iron panel due to PCP request from recent telemedicine visit.

## 2020-01-01 LAB — CERVICOVAGINAL ANCILLARY ONLY
Chlamydia: NEGATIVE
Comment: NEGATIVE
Comment: NORMAL
Neisseria Gonorrhea: NEGATIVE

## 2020-01-01 LAB — IRON,TIBC AND FERRITIN PANEL
Ferritin: 42 ng/mL (ref 15–150)
Iron Saturation: 27 % (ref 15–55)
Iron: 78 ug/dL (ref 27–159)
Total Iron Binding Capacity: 289 ug/dL (ref 250–450)
UIBC: 211 ug/dL (ref 131–425)

## 2020-01-01 LAB — CBC
Hematocrit: 37.6 % (ref 34.0–46.6)
Hemoglobin: 12.5 g/dL (ref 11.1–15.9)
MCH: 31.3 pg (ref 26.6–33.0)
MCHC: 33.2 g/dL (ref 31.5–35.7)
MCV: 94 fL (ref 79–97)
Platelets: 266 10*3/uL (ref 150–450)
RBC: 4 x10E6/uL (ref 3.77–5.28)
RDW: 11.8 % (ref 11.7–15.4)
WBC: 8.2 10*3/uL (ref 3.4–10.8)

## 2020-01-01 LAB — TSH: TSH: 0.479 u[IU]/mL (ref 0.450–4.500)

## 2020-01-01 LAB — CYTOLOGY - PAP: Diagnosis: NEGATIVE

## 2020-01-01 LAB — HIV ANTIBODY (ROUTINE TESTING W REFLEX): HIV Screen 4th Generation wRfx: NONREACTIVE

## 2020-01-01 LAB — RPR: RPR Ser Ql: NONREACTIVE

## 2020-01-14 NOTE — Progress Notes (Signed)
   Subjective:   Patient ID: Miranda Fischer    DOB: [birth year], 24 y.o. female   MRN: [id number]  Miranda Fischer is a 24 y.o. female here for abnormal discharge.  Abnormal Discharge: Patient here today for abnormal discharge x years. She notes it is white and creamy. Denies any itching or odor. She notes this is somewhat chronic but her tests keep coming back negative. Sexually active with men and women. She is frustrated because no one can tell her any answers and this is very embarrassing. Denies any pelvic pain. Seh notes she saw a gynecologist in 2020 who treated her with 30 days of boric acid suppositories and chronic maintenance metronidazole. She does note some improvement with this treatment - it was less amount, however it was very difficult to take that medicine every day.  Was seen on 12/31/19 for STD screening and pap-smear which was negative. STD testing at that time was negative including GC/Cl, HIV, and RPR. Currently on OCPs for birth control. LMP ~12/31/19.   Review of Systems:  Per HPI.   Objective:   BP 110/70   Pulse 75   Wt 196 lb 9.6 oz (89.2 kg)   LMP 01/01/2020 (Approximate)   SpO2 98%   BMI 31.73 kg/m  Vitals and nursing note reviewed.  General: well Appearing yet tearful throughout exam, well nourished, well developed, in no acute distress with non-toxic appearance, sitting comfortably in exam chair Resp: Breathing comfortably on room air, speaking in full sentences Skin: warm, dry Extremities: warm and well perfused MSK: R gait normal Neuro: Alert and oriented, speech normal Pelvic exam: VULVA: normal appearing vulva with no masses, tenderness or lesions, VAGINA: normal appearing vagina with normal color, thick white discharge at posterior vagina, no odor, no lesions, CERVIX: normal appearing cervix without discharge or lesions, UTERUS: uterus is normal size, shape, consistency and nontender, ADNEXA: normal adnexa in size, nontender and no masses,  exam chaperoned by Leonia Corona.  Assessment & Plan:   Vaginal discharge Wet prep notable for yeast infection which is consistent to discharge noted on exam. Unclear etiology for patient's chronic thick white vaginal discharge given negative for yeast on last wet prep on 12/31/19. She has been evaluated by OBGYN in the past and treated for chronic BV with boric acid suppositories and PO Flagyl with only some improvement. This is significantly troublesome for patient. - Will treat with Fluconazole 150mg : one tablet now then another in 72 hours. RTC if no improvement. - Ambulatory referral to gynecology placed.  Recommended patient call around to find a gynecologist that accepts her insurance.  She understood and agreed to plan.  Orders Placed This Encounter  Procedures  . Ambulatory referral to Gynecology    Referral Priority:   Routine    Referral Type:   Consultation    Referral Reason:   Specialty Services Required    Requested Specialty:   Gynecology    Number of Visits Requested:   1  . POCT Wet Prep [facility])   Meds ordered this encounter  Medications  . fluconazole (DIFLUCAN) 150 MG tablet    Sig: Take 1 tablet (150mg ) once, then take 1 tablet 72 hours later.    Dispense:  2 tablet    Refill:  0    Mina Marble, DO PGY-2, [facility] 01/15/2020 12:11 PM

## 2020-01-15 ENCOUNTER — Other Ambulatory Visit: Payer: Self-pay

## 2020-01-15 ENCOUNTER — Encounter: Payer: Self-pay | Admitting: Family Medicine

## 2020-01-15 ENCOUNTER — Ambulatory Visit: Payer: Medicaid Other | Admitting: Family Medicine

## 2020-01-15 VITALS — BP 110/70 | HR 75 | Wt 196.6 lb

## 2020-01-15 DIAGNOSIS — B373 Candidiasis of vulva and vagina: Secondary | ICD-10-CM | POA: Diagnosis not present

## 2020-01-15 DIAGNOSIS — N898 Other specified noninflammatory disorders of vagina: Secondary | ICD-10-CM | POA: Diagnosis not present

## 2020-01-15 DIAGNOSIS — B3731 Acute candidiasis of vulva and vagina: Secondary | ICD-10-CM

## 2020-01-15 LAB — POCT WET PREP (WET MOUNT)
Clue Cells Wet Prep Whiff POC: NEGATIVE
Trichomonas Wet Prep HPF POC: ABSENT

## 2020-01-15 MED ORDER — FLUCONAZOLE 150 MG PO TABS: ORAL_TABLET | ORAL | 0 refills | Status: DC

## 2020-01-15 NOTE — Patient Instructions (Addendum)
Thank you for coming to see me today. It was a pleasure to see you.   Obtain a wet prep to further evaluate your discharge.  I will call you with results. Have placed a referral to gynecology.  Please call around to find a gynecologist that except your insurance.  Try to maintain a healthy balanced diet, appropriate hydration, avoiding tight clothing/thong underwear, and sensitive soaps  If you have any questions or concerns, please do not hesitate to call the office at [phone].  Take Care,   Dr. Mina Marble, DO Resident Physician [place] [phone]

## 2020-01-15 NOTE — Assessment & Plan Note (Signed)
Wet prep notable for yeast infection which is consistent to discharge noted on exam. Unclear etiology for patient's chronic thick white vaginal discharge given negative for yeast on last wet prep on 12/31/19. She has been evaluated by OBGYN in the past and treated for chronic BV with boric acid suppositories and PO Flagyl with only some improvement. This is significantly troublesome for patient. - Will treat with Fluconazole 150mg : one tablet now then another in 72 hours. RTC if no improvement. - Ambulatory referral to gynecology placed.  Recommended patient call around to find a gynecologist that accepts her insurance.  She understood and agreed to plan.

## 2020-01-27 ENCOUNTER — Ambulatory Visit: Payer: Medicaid Other | Admitting: Obstetrics

## 2020-01-27 ENCOUNTER — Other Ambulatory Visit: Payer: Self-pay

## 2020-01-27 ENCOUNTER — Other Ambulatory Visit (HOSPITAL_COMMUNITY)
Admission: RE | Admit: 2020-01-27 | Discharge: 2020-01-27 | Disposition: A | Discharge status: Home / Self Care | Payer: Medicaid Other | Source: Ambulatory Visit | Attending: Obstetrics | Admitting: Obstetrics

## 2020-01-27 ENCOUNTER — Encounter: Payer: Self-pay | Admitting: Obstetrics

## 2020-01-27 VITALS — BP 120/75 | HR 79 | Wt 196.0 lb

## 2020-01-27 DIAGNOSIS — N898 Other specified noninflammatory disorders of vagina: Secondary | ICD-10-CM | POA: Insufficient documentation

## 2020-01-27 NOTE — Progress Notes (Signed)
Patient ID: Miranda Fischer, female   DOB: [birth year], 24 y.o.   MRN: [id number]  Chief Complaint  Patient presents with  . Establish Care    HPI Miranda Fischer is a 24 y.o. female.  Complains of vaginal discharge.  Denies vaginal odor or irritation. HPI  History reviewed. No pertinent past medical history.  History reviewed. No pertinent surgical history.  Family History  Family history unknown: Yes    Social History Social History   Tobacco Use  . Smoking status: Never Smoker  . Smokeless tobacco: Never Used  Substance Use Topics  . Alcohol use: Yes    Comment: occasionally  . Drug use: Yes    Types: Marijuana    Comment: socially    Allergies  Allergen Reactions  . Penicillins Hives    Has patient had a PCN reaction causing immediate rash, facial/tongue/throat swelling, SOB or lightheadedness with hypotension: no Has patient had a PCN reaction causing severe rash involving mucus membranes or skin necrosis: no Has patient had a PCN reaction that required hospitalization: no Has patient had a PCN reaction occurring within the last 10 years:no If all of the above answers are "NO", then may proceed with Cephalosporin use.  . Shellfish Allergy Hives    Current Outpatient Medications  Medication Sig Dispense Refill  . Norethindrone Acetate-Ethinyl Estrad-FE (LOESTRIN 24 FE) 1-20 MG-MCG(24) tablet Take 1 tablet by mouth daily. 3 Package 3   No current facility-administered medications for this visit.    Review of Systems Review of Systems Constitutional: negative for fatigue and weight loss Respiratory: negative for cough and wheezing Cardiovascular: negative for chest pain, fatigue and palpitations Gastrointestinal: negative for abdominal pain and change in bowel habits Genitourinary:negative Integument/breast: negative for nipple discharge Musculoskeletal:negative for myalgias Neurological: negative for gait problems and  tremors Behavioral/Psych: negative for abusive relationship, depression Endocrine: negative for temperature intolerance      Blood pressure 120/75, pulse 79, weight 196 lb (88.9 kg), last menstrual period 01/23/2020.  Physical Exam Physical Exam General:   alert  Skin:   no rash or abnormalities  Lungs:   clear to auscultation bilaterally  Heart:   regular rate and rhythm, S1, S2 normal, no murmur, click, rub or gallop  Breasts:   normal without suspicious masses, skin or nipple changes or axillary nodes  Abdomen:  normal findings: no organomegaly, soft, non-tender and no hernia  Pelvis:  External genitalia: normal general appearance Urinary system: urethral meatus normal and bladder without fullness, nontender Vaginal: normal without tenderness, induration or masses Cervix: normal appearance Adnexa: normal bimanual exam Uterus: anteverted and non-tender, normal size    50% of 15 min visit spent on counseling and coordination of care.   Data Reviewed Wet Prep  Assessment     1. Vaginal discharge Rx: - Cervicovaginal ancillary only    Plan   Follow up prn  No orders of the defined types were placed in this encounter.  No orders of the defined types were placed in this encounter.    Miranda Bombard, MD 01/27/2020 8:52 AM

## 2020-01-28 LAB — CERVICOVAGINAL ANCILLARY ONLY
Bacterial Vaginitis (gardnerella): NEGATIVE
Candida Glabrata: NEGATIVE
Candida Vaginitis: NEGATIVE
Chlamydia: NEGATIVE
Comment: NEGATIVE
Comment: NEGATIVE
Comment: NEGATIVE
Comment: NEGATIVE
Comment: NEGATIVE
Comment: NORMAL
Neisseria Gonorrhea: NEGATIVE
Trichomonas: NEGATIVE

## 2020-02-28 DIAGNOSIS — M62830 Muscle spasm of back: Secondary | ICD-10-CM

## 2020-02-28 HISTORY — DX: Muscle spasm of back: M62.830

## 2020-03-26 ENCOUNTER — Other Ambulatory Visit: Payer: Self-pay

## 2020-03-26 ENCOUNTER — Ambulatory Visit (INDEPENDENT_AMBULATORY_CARE_PROVIDER_SITE_OTHER): Payer: Medicaid Other | Admitting: Family Medicine

## 2020-03-26 VITALS — BP 90/60 | HR 73 | Ht 66.0 in | Wt 200.2 lb

## 2020-03-26 DIAGNOSIS — N760 Acute vaginitis: Secondary | ICD-10-CM

## 2020-03-26 DIAGNOSIS — R399 Unspecified symptoms and signs involving the genitourinary system: Secondary | ICD-10-CM | POA: Diagnosis present

## 2020-03-26 DIAGNOSIS — B9689 Other specified bacterial agents as the cause of diseases classified elsewhere: Secondary | ICD-10-CM

## 2020-03-26 LAB — POCT URINALYSIS DIP (MANUAL ENTRY)
Bilirubin, UA: NEGATIVE
Blood, UA: NEGATIVE
Glucose, UA: NEGATIVE mg/dL
Ketones, POC UA: NEGATIVE mg/dL
Leukocytes, UA: NEGATIVE
Nitrite, UA: NEGATIVE
Protein Ur, POC: NEGATIVE mg/dL
Spec Grav, UA: 1.025 (ref 1.010–1.025)
Urobilinogen, UA: 0.2 E.U./dL
pH, UA: 7 (ref 5.0–8.0)

## 2020-03-26 LAB — POCT WET PREP (WET MOUNT)
Clue Cells Wet Prep Whiff POC: NEGATIVE
Trichomonas Wet Prep HPF POC: ABSENT

## 2020-03-26 MED ORDER — FLUCONAZOLE 150 MG PO TABS: 150.0000 mg | ORAL_TABLET | Freq: Once | ORAL | 0 refills | Status: AC

## 2020-03-26 MED ORDER — METRONIDAZOLE 500 MG PO TABS: 500.0000 mg | ORAL_TABLET | Freq: Two times a day (BID) | ORAL | 0 refills | Status: AC

## 2020-03-26 NOTE — Progress Notes (Signed)
Full PHQ9 given. Miranda Fischer, CMA

## 2020-03-26 NOTE — Progress Notes (Signed)
   SUBJECTIVE:   CHIEF COMPLAINT / HPI:   Vaginal discharge: Patient with recurrent BV.  States that she has over the past week developed white discharge with some discomfort.  She denies any abdominal pain, nausea, vomiting.  She states that she has previously had issues with recurrent BV and has never really been able to get rid of it for long periods of time.  She is quite distraught by this because she cannot be intimate with partners.  She states she prefers female partners.  She does not have any concern for STIs as she has not been able to be sexually active due to her discharge.  Patient reports in the past she has tried long-term use of MetroGel.  She also reports that she has tried boric acid for 3 months at a time.  She has not had any success with any of these.  She was recently seen by an OB/GYN who did not offer her any help at the time and it caused her to be more frustrated.  Patient is here today because her discharge has worsened as well as her odor and she would like to be rechecked and treated for BV.  Patient would like something done about her recurrent BV as it is very stressful for her.  PERTINENT  PMH / PSH: recurrent bv  OBJECTIVE:  BP 90/60   Pulse 73   Ht 5\' 6"  (1.676 m)   Wt 200 lb 4 oz (90.8 kg)   LMP 03/19/2020   SpO2 98%   BMI 32.32 kg/m   General: NAD, pleasant Neck: Supple Respiratory:  normal work of breathing GU/GYN: External genitalia within normal limits.  Vaginal mucosa pink, moist, normal rugae.  Nonfriable cervix without lesions, some thin white discharge noted, no  bleeding noted on speculum exam.  Exam performed in the presence of a chaperone. Psych: AOx3, appropriate affect, tearful  ASSESSMENT/PLAN:   BV (bacterial vaginosis) Patient with reported history of recurrent BV that has required prophylactic therapy including boric acid as well as MetroGel.  She has not noticed any improvement from these.  Today on wet prep there is no signs of BV or  yeast causing her symptoms.  She has been evaluated by OB/GYN who did not offer much help for her however patient is tearful and quite upset that this continues to happen.  She reports that she avoids tight clothing and uses sensitive soaps as well as avoids douching and wears cotton underwear.  She does not take baths.  Offered treatment for metronidazole given her reported symptoms in case she would like to take this as well as Diflucan if she takes the metronidazole.  In the meantime will reach out to ID for further advice for options for patient in the future.    [place] Aarini Slee, DO PGY-3, [facility]

## 2020-04-01 NOTE — Assessment & Plan Note (Signed)
Patient with reported history of recurrent BV that has required prophylactic therapy including boric acid as well as MetroGel.  She has not noticed any improvement from these.  Today on wet prep there is no signs of BV or yeast causing her symptoms.  She has been evaluated by OB/GYN who did not offer much help for her however patient is tearful and quite upset that this continues to happen.  She reports that she avoids tight clothing and uses sensitive soaps as well as avoids douching and wears cotton underwear.  She does not take baths.  Offered treatment for metronidazole given her reported symptoms in case she would like to take this as well as Diflucan if she takes the metronidazole.  In the meantime will reach out to ID for further advice for options for patient in the future.

## 2020-07-27 NOTE — Progress Notes (Signed)
    SUBJECTIVE:   CHIEF COMPLAINT / HPI:   Vaginal discharge:  Complaining of 3 days of vaginal discharge with vaginal pruritus.  Sexually active with same female partner.   No fevers. No abd pain. No dysuria.   PERTINENT  PMH / PSH: BV  OBJECTIVE:   BP 112/72   Pulse 98   Wt 203 lb (92.1 kg)   SpO2 98%   BMI 32.77 kg/m   Gen: alert.  Oriented. No acute distress.   GU: no vaginal lesions.  Whitish discharge seen near cervix. No cervical motion tenderness.    ASSESSMENT/PLAN:   Bacterial vaginosis Wet prep positive for BV.  Pt prefers flagyl topical.  Will prescribe diflucan as well to use at end of bv treatment if needed as pt has a hx of candidiasis as well.  Will f/u STI testing with pt too.       Benay Pike, MD [place]

## 2020-07-28 ENCOUNTER — Other Ambulatory Visit (HOSPITAL_COMMUNITY)
Admission: RE | Admit: 2020-07-28 | Discharge: 2020-07-28 | Disposition: A | Discharge status: Home / Self Care | Payer: Medicaid Other | Source: Ambulatory Visit | Attending: Family Medicine | Admitting: Family Medicine

## 2020-07-28 ENCOUNTER — Other Ambulatory Visit: Payer: Self-pay

## 2020-07-28 ENCOUNTER — Ambulatory Visit (INDEPENDENT_AMBULATORY_CARE_PROVIDER_SITE_OTHER): Payer: Medicaid Other | Admitting: Family Medicine

## 2020-07-28 ENCOUNTER — Encounter: Payer: Self-pay | Admitting: Family Medicine

## 2020-07-28 VITALS — BP 112/72 | HR 98 | Wt 203.0 lb

## 2020-07-28 DIAGNOSIS — Z113 Encounter for screening for infections with a predominantly sexual mode of transmission: Secondary | ICD-10-CM | POA: Insufficient documentation

## 2020-07-28 DIAGNOSIS — N760 Acute vaginitis: Secondary | ICD-10-CM

## 2020-07-28 DIAGNOSIS — B9689 Other specified bacterial agents as the cause of diseases classified elsewhere: Secondary | ICD-10-CM | POA: Diagnosis not present

## 2020-07-28 LAB — POCT WET PREP (WET MOUNT)
Clue Cells Wet Prep Whiff POC: POSITIVE
Trichomonas Wet Prep HPF POC: ABSENT

## 2020-07-28 MED ORDER — METRONIDAZOLE 500 MG PO TABS: 500.0000 mg | ORAL_TABLET | Freq: Two times a day (BID) | ORAL | 0 refills | Status: DC

## 2020-07-28 MED ORDER — FLUCONAZOLE 150 MG PO TABS: 150.0000 mg | ORAL_TABLET | Freq: Once | ORAL | 0 refills | Status: AC

## 2020-07-28 MED ORDER — METRONIDAZOLE 0.75 % VA GEL: 1.0000 | Freq: Every day | VAGINAL | 0 refills | Status: DC

## 2020-07-28 NOTE — Patient Instructions (Signed)
It was nice to see you today,  Your wet prep showed clue cells which indicate bacterial vaginosis.  I will treat this with twice a day metronidazole for 7 days.  I will also prescribe a dose of fluconazole for you to take at the end of the 7 days if you feel like you are developing a yeast infection, sometimes antibiotics can trigger yeast infection.  You should schedule an appointment with your PCP, Dr. Posey Pronto, to discuss your chronic health maintenance issues.  I will call you with results of the rest of the blood work when I get them.  Have a great day,  Clemetine Marker, MD

## 2020-07-29 LAB — HIV ANTIBODY (ROUTINE TESTING W REFLEX): HIV Screen 4th Generation wRfx: NONREACTIVE

## 2020-07-29 LAB — HEPATITIS C ANTIBODY: Hep C Virus Ab: <0.1 s/co ratio (ref 0.0–0.9)

## 2020-07-29 LAB — RPR: RPR Ser Ql: NONREACTIVE

## 2020-07-30 LAB — CERVICOVAGINAL ANCILLARY ONLY
Chlamydia: NEGATIVE
Comment: NEGATIVE
Comment: NORMAL
Neisseria Gonorrhea: NEGATIVE

## 2020-07-31 NOTE — Assessment & Plan Note (Signed)
Wet prep positive for BV.  Pt prefers flagyl topical.  Will prescribe diflucan as well to use at end of bv treatment if needed as pt has a hx of candidiasis as well.  Will f/u STI testing with pt too.

## 2020-08-30 NOTE — Progress Notes (Signed)
° ° °  SUBJECTIVE:   CHIEF COMPLAINT / HPI:   Miranda Fischer is a 24 yr old female who presents today for physical  Concern for BV Patient has been seen multiple times in the clinic for bacterial vaginosis.  She denies current symptoms of vaginal discharge however she would like to have a wet prep to check that is not there. LMP 23rd October.  Is sexually active with females only.  Is not on birth control.  No abnormal vaginal bleeding.  Health maintenance: Denies COVID and influenza vaccines   PERTINENT  PMH / PSH: Bacterial vaginosis  OBJECTIVE:   BP 104/62    Pulse 79    Ht 5\' 6"  (1.676 m)    Wt 206 lb 6.4 oz (93.6 kg)    LMP 07/22/2020 (Approximate)    SpO2 97%    BMI 33.31 kg/m    General: Alert, well-appearing no acute distress Cardio: Normal S1 and S2, RRR Pulm: CTAB, normal respiratory effort Abdomen: Bowel sounds normal. Abdomen soft and non-tender.  Extremities: No peripheral edema. Warm/ well perfused.  . Neuro: Cranial nerves grossly intact Pelvic exam: Chaperoned by CMA Secundino Ginger.  Cervix visualized with creamy white discharge.  Wet prep obtained.  ASSESSMENT/PLAN:   Bacterial vaginosis Patient asymptomatic however would like wet prep to rule out BV.  I explained that this is not a test we usually do to rule out BV as it usually presents with symptoms such as vaginal discharge, however I am happy to do a wet prep this time.  Will contact patient of the results/released them on my chart and send appropriate medications to pharmacy.  Did not obtain GC chlamydia as no recent changes in sexual partners and patient is only sexually active with females.   Offered patient Covid vaccine today and explained risks and benefits. She declined and she would like to think about it more before she gets it. Provided patient with further information about vaccine and provided CDC website link.    Lattie Haw, MD  PGY-2 [place]

## 2020-08-31 ENCOUNTER — Telehealth: Payer: Self-pay | Admitting: Family Medicine

## 2020-08-31 ENCOUNTER — Encounter: Payer: Self-pay | Admitting: Family Medicine

## 2020-08-31 ENCOUNTER — Other Ambulatory Visit: Payer: Self-pay

## 2020-08-31 ENCOUNTER — Ambulatory Visit: Payer: Medicaid Other | Admitting: Family Medicine

## 2020-08-31 VITALS — BP 104/62 | HR 79 | Ht 66.0 in | Wt 206.4 lb

## 2020-08-31 DIAGNOSIS — B9689 Other specified bacterial agents as the cause of diseases classified elsewhere: Secondary | ICD-10-CM | POA: Diagnosis not present

## 2020-08-31 DIAGNOSIS — N760 Acute vaginitis: Secondary | ICD-10-CM

## 2020-08-31 LAB — POCT WET PREP (WET MOUNT)
Clue Cells Wet Prep Whiff POC: NEGATIVE
Trichomonas Wet Prep HPF POC: ABSENT

## 2020-08-31 NOTE — Assessment & Plan Note (Addendum)
Patient asymptomatic however would like wet prep to rule out BV.  I explained that this is not a test we usually do to rule out BV as it usually presents with symptoms such as vaginal discharge, however I am happy to do a wet prep this time.  Will contact patient of the results/released them on my chart and send appropriate medications to pharmacy.  Did not obtain GC chlamydia as no recent changes in sexual partners and patient is only sexually active with females.

## 2020-08-31 NOTE — Patient Instructions (Addendum)
Ms. Sontee, Desena to see you today!!  Your physical was normal today we did a wet prep to look for bacterial vaginosis and yeast.  The results will pop up on my chart later on.  If you do have an infection I will send you the meds.  In order to prevent bacterial vaginosis you can try boric acid suppositories which you can buy from [employer].  Please consider getting the Covid vaccine.  You can visit the CDC for further information on Covid vaccinations.  [email or web address].ee:[email or web address]:[email or web address]:[email or web address]:[email or web address]  Best wishes,  Dr. Posey Pronto

## 2020-08-31 NOTE — Telephone Encounter (Signed)
Contacted patient regarding wet prep which was negative.

## 2020-12-15 ENCOUNTER — Other Ambulatory Visit: Payer: Self-pay

## 2020-12-15 ENCOUNTER — Emergency Department (INDEPENDENT_AMBULATORY_CARE_PROVIDER_SITE_OTHER)
Admission: RE | Admit: 2020-12-15 | Discharge: 2020-12-15 | Disposition: A | Discharge status: Home / Self Care | Payer: Medicaid Other | Source: Ambulatory Visit

## 2020-12-15 ENCOUNTER — Other Ambulatory Visit (HOSPITAL_COMMUNITY)
Admission: RE | Admit: 2020-12-15 | Discharge: 2020-12-15 | Disposition: A | Discharge status: Home / Self Care | Payer: Medicaid Other | Source: Ambulatory Visit | Attending: Family Medicine | Admitting: Family Medicine

## 2020-12-15 VITALS — BP 117/80 | HR 68 | Temp 98.4°F | Resp 18 | Ht 66.0 in | Wt 205.0 lb

## 2020-12-15 DIAGNOSIS — N898 Other specified noninflammatory disorders of vagina: Secondary | ICD-10-CM

## 2020-12-15 LAB — POCT URINALYSIS DIP (MANUAL ENTRY)
Bilirubin, UA: NEGATIVE
Blood, UA: NEGATIVE
Glucose, UA: NEGATIVE mg/dL
Ketones, POC UA: NEGATIVE mg/dL
Leukocytes, UA: NEGATIVE
Nitrite, UA: NEGATIVE
Protein Ur, POC: NEGATIVE mg/dL
Spec Grav, UA: 1.025 (ref 1.010–1.025)
Urobilinogen, UA: 0.2 E.U./dL
pH, UA: 7 (ref 5.0–8.0)

## 2020-12-15 MED ORDER — METRONIDAZOLE 500 MG PO TABS: 500.0000 mg | ORAL_TABLET | Freq: Two times a day (BID) | ORAL | 0 refills | Status: DC

## 2020-12-15 MED ORDER — FLUCONAZOLE 150 MG PO TABS: 150.0000 mg | ORAL_TABLET | Freq: Every day | ORAL | 0 refills | Status: DC

## 2020-12-15 NOTE — ED Provider Notes (Signed)
[facility]    CSN: [id number] Arrival date & time: 12/15/20  1656      History   Chief Complaint Chief Complaint  Patient presents with  . Vaginal Discharge    HPI Miranda Fischer is a 25 y.o. female.   HPI   Here for acute vaginitis,   has had both yeast and BV infections Is with one female partner and does not think she has been exposed to STD No NVD No fever or chills No urinary symproms  History reviewed. No pertinent past medical history.  Patient Active Problem List   Diagnosis Date Noted  . Vaginal discharge 01/15/2020  . Screening examination for STD (sexually transmitted disease) 12/31/2019  . Screening for cervical cancer 12/31/2019  . At risk for anemia 12/13/2019  . Mental health problem 12/13/2019  . Bacterial vaginosis 11/11/2019  . Abnormal TSH 11/11/2019  . 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 No pertinent surgical history.  OB History   No obstetric history on file.      Home Medications    Prior to Admission medications   Medication Sig Start Date End Date Taking? Authorizing Provider  fluconazole (DIFLUCAN) 150 MG tablet Take 1 tablet (150 mg total) by mouth daily. Repeat in 1 week if needed 12/15/20  Yes Raylene Everts, MD  metroNIDAZOLE (FLAGYL) 500 MG tablet Take 1 tablet (500 mg total) by mouth 2 (two) times daily. 12/15/20  Yes Raylene Everts, MD    Family History Family History  Family history unknown: Yes    Social History Social History   Tobacco Use  . Smoking status: Never Smoker  . Smokeless tobacco: Never Used  Vaping Use  . Vaping Use: Never used  Substance Use Topics  . Alcohol use: Yes    Comment: occasionally  . Drug use: Yes    Types: Marijuana    Comment: socially     Allergies   Penicillins and Shellfish allergy   Review of Systems Review of Systems See HPI  Physical Exam Triage Vital Signs ED Triage Vitals  Enc Vitals Group     BP  12/15/20 1711 117/80     Pulse Rate 12/15/20 1711 68     Resp 12/15/20 1711 18     Temp 12/15/20 1711 98.4 F (36.9 C)     Temp Source 12/15/20 1711 Oral     SpO2 12/15/20 1711 97 %     Weight 12/15/20 1712 205 lb (93 kg)     Height 12/15/20 1712 5\' 6"  (1.676 m)     Head Circumference --      Peak Flow --      Pain Score 12/15/20 1712 0     Pain Loc --      Pain Edu? --      Excl. in [place]? --    No data found.  Updated Vital Signs BP 117/80 (BP Location: Left Arm)   Pulse 68   Temp 98.4 F (36.9 C) (Oral)   Resp 18   Ht 5\' 6"  (1.676 m)   Wt 93 kg   LMP 12/05/2020 (Approximate)   SpO2 97%   BMI 33.09 kg/m   Physical Exam Constitutional:      General: She is not in acute distress.    Appearance: She is well-developed and well-nourished.  HENT:     Head: Normocephalic and atraumatic.     Mouth/Throat:     Mouth: Oropharynx is clear and moist.  Comments: Mask is in place Eyes:     Conjunctiva/sclera: Conjunctivae normal.     Pupils: Pupils are equal, round, and reactive to light.  Cardiovascular:     Rate and Rhythm: Normal rate.  Pulmonary:     Effort: Pulmonary effort is normal. No respiratory distress.  Abdominal:     General: There is no distension.     Palpations: Abdomen is soft.     Tenderness: There is no right CVA tenderness or left CVA tenderness.  Musculoskeletal:        General: No edema. Normal range of motion.     Cervical back: Normal range of motion.  Skin:    General: Skin is warm and dry.  Neurological:     Mental Status: She is alert.  Psychiatric:        Mood and Affect: Mood normal.      UC Treatments / Results  Labs (all labs ordered are listed, but only abnormal results are displayed) Labs Reviewed  POCT URINALYSIS DIP (MANUAL ENTRY) - Abnormal; Notable for the following components:      Result Value   Clarity, UA cloudy (*)    All other components within normal limits  CERVICOVAGINAL ANCILLARY ONLY     EKG   Radiology No results found.  Procedures Procedures (including critical care time)  Medications Ordered in UC Medications - No data to display  Initial Impression / Assessment and Plan / UC Course  I have reviewed the triage vital signs and the nursing notes.  Pertinent labs & imaging results that were available during my care of the patient were reviewed by me and considered in my medical decision making (see chart for details).     Final Clinical Impressions(s) / UC Diagnoses   Final diagnoses:  Vaginal discharge     Discharge Instructions     Take the Diflucan tonight.  Repeat in 1 week Take metronidazole 2 times a day for 7 days Avoid sexual encounters for 7 days Consider taking a probiotic for women once a day.  This is often helpful in preventing yeast infections and BV     ED Prescriptions    Medication Sig Dispense Auth. Provider   metroNIDAZOLE (FLAGYL) 500 MG tablet Take 1 tablet (500 mg total) by mouth 2 (two) times daily. 14 tablet Raylene Everts, MD   fluconazole (DIFLUCAN) 150 MG tablet Take 1 tablet (150 mg total) by mouth daily. Repeat in 1 week if needed 2 tablet Raylene Everts, MD     PDMP not reviewed this encounter.   Raylene Everts, MD 12/15/20 [phone]

## 2020-12-15 NOTE — Discharge Instructions (Signed)
Take the Diflucan tonight.  Repeat in 1 week Take metronidazole 2 times a day for 7 days Avoid sexual encounters for 7 days Consider taking a probiotic for women once a day.  This is often helpful in preventing yeast infections and BV

## 2020-12-15 NOTE — ED Triage Notes (Addendum)
Vaginal discharge, white, clumpy, irritated started last night.HX BV and yeast

## 2020-12-17 LAB — CERVICOVAGINAL ANCILLARY ONLY
Bacterial Vaginitis (gardnerella): NEGATIVE
Candida Glabrata: NEGATIVE
Candida Vaginitis: POSITIVE — AB
Chlamydia: NEGATIVE
Comment: NEGATIVE
Comment: NEGATIVE
Comment: NEGATIVE
Comment: NEGATIVE
Comment: NEGATIVE
Comment: NORMAL
Neisseria Gonorrhea: NEGATIVE
Trichomonas: NEGATIVE

## 2021-02-09 ENCOUNTER — Other Ambulatory Visit: Payer: Self-pay | Admitting: Family Medicine

## 2021-02-09 NOTE — Telephone Encounter (Signed)
Refilled metronidazole. Please inform pt. Thank you.

## 2021-02-09 NOTE — Telephone Encounter (Signed)
Patient Miranda Fischer on nurse line stating she needs a medication refill for the "thing she had last time." Patient reports she was told by PCP to just call next time verses having to make an apt since she gets this so often. I attempted to call back patient, however no answer. Patieint had BV at last visit. Please advise on refill. Of note, we have not clinic apts left for this week.

## 2021-02-14 ENCOUNTER — Ambulatory Visit: Payer: Medicaid Other

## 2021-02-14 NOTE — Progress Notes (Deleted)
    SUBJECTIVE:   CHIEF COMPLAINT / HPI:   Discharge: candida positive in feb.  On birth control?   PERTINENT  PMH / PSH: ***  OBJECTIVE:   There were no vitals taken for this visit.  ***  ASSESSMENT/PLAN:   No problem-specific Assessment & Plan notes found for this encounter.     Benay Pike, MD [place]   {    This will disappear when note is signed, click to select method of visit    :1}

## 2021-02-17 NOTE — Addendum Note (Signed)
Addended by: Talbot Grumbling on: 02/17/2021 04:54 PM   Modules accepted: Orders

## 2021-02-17 NOTE — Telephone Encounter (Signed)
Patient returns call to nurse line requesting diflucan for yeast infection. Patient was recently treated for BV and reports that she gets a yeast infection after flagyl treatment.   Please advise.   Talbot Grumbling, RN

## 2021-02-23 ENCOUNTER — Other Ambulatory Visit: Payer: Self-pay

## 2021-02-23 ENCOUNTER — Emergency Department (HOSPITAL_BASED_OUTPATIENT_CLINIC_OR_DEPARTMENT_OTHER)
Admission: EM | Admit: 2021-02-23 | Discharge: 2021-02-23 | Disposition: A | Discharge status: Home / Self Care | Payer: Medicaid Other | Attending: Emergency Medicine | Admitting: Emergency Medicine

## 2021-02-23 ENCOUNTER — Encounter (HOSPITAL_BASED_OUTPATIENT_CLINIC_OR_DEPARTMENT_OTHER): Payer: Self-pay

## 2021-02-23 DIAGNOSIS — M545 Low back pain, unspecified: Secondary | ICD-10-CM | POA: Diagnosis present

## 2021-02-23 DIAGNOSIS — F17210 Nicotine dependence, cigarettes, uncomplicated: Secondary | ICD-10-CM | POA: Diagnosis not present

## 2021-02-23 DIAGNOSIS — M5442 Lumbago with sciatica, left side: Secondary | ICD-10-CM | POA: Insufficient documentation

## 2021-02-23 MED ORDER — PREDNISONE 50 MG PO TABS
60.0000 mg | ORAL_TABLET | Freq: Once | ORAL | Status: AC
  Administered 2021-02-23: 60 mg via ORAL
  Filled 2021-02-23: qty 1

## 2021-02-23 MED ORDER — METHOCARBAMOL 500 MG PO TABS
500.0000 mg | ORAL_TABLET | Freq: Once | ORAL | Status: AC
  Administered 2021-02-23: 500 mg via ORAL
  Filled 2021-02-23: qty 1

## 2021-02-23 MED ORDER — ONDANSETRON 4 MG PO TBDP
4.0000 mg | ORAL_TABLET | Freq: Once | ORAL | Status: AC
  Administered 2021-02-23: 4 mg via ORAL
  Filled 2021-02-23: qty 1

## 2021-02-23 MED ORDER — OXYCODONE-ACETAMINOPHEN 5-325 MG PO TABS: 1.0000 | ORAL_TABLET | Freq: Four times a day (QID) | ORAL | 0 refills | Status: DC | PRN

## 2021-02-23 MED ORDER — OXYCODONE-ACETAMINOPHEN 5-325 MG PO TABS
1.0000 | ORAL_TABLET | Freq: Once | ORAL | Status: AC
  Administered 2021-02-23: 1 via ORAL
  Filled 2021-02-23: qty 1

## 2021-02-23 MED ORDER — PREDNISONE 20 MG PO TABS: 20.0000 mg | ORAL_TABLET | Freq: Every day | ORAL | 0 refills | Status: AC

## 2021-02-23 MED ORDER — METHOCARBAMOL 500 MG PO TABS: 500.0000 mg | ORAL_TABLET | Freq: Two times a day (BID) | ORAL | 0 refills | Status: DC | PRN

## 2021-02-23 NOTE — ED Notes (Signed)
Pt unable to ambulate into room without excruciating pain. Having to lay on stomach, pain worse with any kind of movement. States tingling in legs when pain worsens. Denies bladder/bowel involvement

## 2021-02-23 NOTE — ED Provider Notes (Signed)
[facility] EMERGENCY DEPARTMENT Provider Note   CSN: [id number] Arrival date & time: 02/23/21  7253     History Chief Complaint  Patient presents with  . Back Pain    Miranda Fischer is a 25 y.o. female presenting for evaluation of right-sided back pain that began on Sunday.  Has had similar pain in the past attributed to back spasm.  She feels like her right low back is being squeezed and twisted.  Her symptoms are minimal at rest, especially when she lays prone, though with movement it worsens.  She has radiating pain down the back of her leg, which caused her leg to give out yesterday when she was walking.  Has some tingling sensations as well.  Denies any recent injuries, denies bowel or bladder incontinence, saddle paresthesia, fever, urinary symptoms.  Similar symptoms in the past, followed by PCP.  Treated with pain medication, steroid and muscle relaxant with quite a bit of relief.  No particular recent injury.  The history is provided by the patient.       Past Medical History:  Diagnosis Date  . Back spasm 02/28/2020    Patient Active Problem List   Diagnosis Date Noted  . Vaginal discharge 01/15/2020  . Screening examination for STD (sexually transmitted disease) 12/31/2019  . Screening for cervical cancer 12/31/2019  . At risk for anemia 12/13/2019  . Mental health problem 12/13/2019  . Bacterial vaginosis 11/11/2019  . Abnormal TSH 11/11/2019  . 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 No pertinent surgical history.   OB History   No obstetric history on file.     Family History  Family history unknown: Yes    Social History   Tobacco Use  . Smoking status: Current Every Day Smoker    Types: Cigarettes  . Smokeless tobacco: Never Used  . Tobacco comment: Black and milds  Vaping Use  . Vaping Use: Never used  Substance Use Topics  . Alcohol use: Yes    Comment: occasionally  . Drug use: Yes    Types:  Marijuana    Comment: socially    Home Medications Prior to Admission medications   Medication Sig Start Date End Date Taking? Authorizing Provider  methocarbamol (ROBAXIN) 500 MG tablet Take 1 tablet (500 mg total) by mouth 2 (two) times daily as needed for muscle spasms. 02/23/21  Yes Vannessa Godown, [place] N, PA-C  oxyCODONE-acetaminophen (PERCOCET/ROXICET) 5-325 MG tablet Take 1-2 tablets by mouth every 6 (six) hours as needed for severe pain. 02/23/21  Yes Kiyana Vazguez, [place] N, PA-C  predniSONE (DELTASONE) 20 MG tablet Take 1 tablet (20 mg total) by mouth daily for 5 days. 02/23/21 02/28/21 Yes Quentin Cornwall, [place] N, PA-C  fluconazole (DIFLUCAN) 150 MG tablet Take 1 tablet (150 mg total) by mouth daily. Repeat in 1 week if needed 12/15/20   Raylene Everts, MD  metroNIDAZOLE (FLAGYL) 500 MG tablet Take 1 tablet (500 mg total) by mouth 2 (two) times daily. 12/15/20   Raylene Everts, MD  metroNIDAZOLE (METROGEL) 0.75 % vaginal gel INSERT 1 APPLICATORFUL VAGINALLY AT BEDTIME FOR 7 DAYS 02/09/21   Lattie Haw, MD    Allergies    Penicillins and Shellfish allergy  Review of Systems   Review of Systems  Musculoskeletal: Positive for back pain.  All other systems reviewed and are negative.   Physical Exam Updated Vital Signs BP 112/65 (BP Location: Right Arm)   Pulse (!) 57   Temp 98.4 F (  36.9 C) (Oral)   Resp 16   Ht 5\' 6"  (1.676 m)   Wt 93 kg   LMP 02/18/2021   SpO2 99%   BMI 33.09 kg/m   Physical Exam Vitals and nursing note reviewed.  Constitutional:      General: She is not in acute distress.    Appearance: She is well-developed.  HENT:     Head: Normocephalic and atraumatic.  Eyes:     Conjunctiva/sclera: Conjunctivae normal.  Cardiovascular:     Rate and Rhythm: Normal rate and regular rhythm.  Pulmonary:     Effort: Pulmonary effort is normal. No respiratory distress.     Breath sounds: Normal breath sounds.  Abdominal:     Palpations: Abdomen is soft.   Musculoskeletal:     Comments: No significant tenderness to palpation of the right paralumbar musculature.  No midline bony tenderness.  Pain with movement of the right leg  Skin:    General: Skin is warm.  Neurological:     Mental Status: She is alert.     Comments: Normal tone.  5/5 strength in BLE including strong and equal dorsiflexion/plantar flexion Sensory:light touch normal in BLE extremities.   Gait: normal gait and balance CV: distal pulses palpable throughout  Psychiatric:        Behavior: Behavior normal.     ED Results / Procedures / Treatments   Labs (all labs ordered are listed, but only abnormal results are displayed) Labs Reviewed - No data to display  EKG None  Radiology No results found.  Procedures Procedures   Medications Ordered in ED Medications  oxyCODONE-acetaminophen (PERCOCET/ROXICET) 5-325 MG per tablet 1 tablet (1 tablet Oral Given 02/23/21 1051)  predniSONE (DELTASONE) tablet 60 mg (60 mg Oral Given 02/23/21 1051)  methocarbamol (ROBAXIN) tablet 500 mg (500 mg Oral Given 02/23/21 1051)  ondansetron (ZOFRAN-ODT) disintegrating tablet 4 mg (4 mg Oral Given 02/23/21 1051)    ED Course  I have reviewed the triage vital signs and the nursing notes.  Pertinent labs & imaging results that were available during my care of the patient were reviewed by me and considered in my medical decision making (see chart for details).  Clinical Course as of 02/23/21 1253  Wed Feb 23, 2021  1235 Patient re-evaluated, reports significant improvement in symptoms.  She is ambulating in the ED to the restroom.  Will discharge with symptomatic management, PCP follow-up. [JR]    Clinical Course User Index [JR] Marleigh Kaylor, [place] N, PA-C   MDM Rules/Calculators/A&P                          Patient here with low back pain radiating down the right leg.  Consistent with sciatica.  Normal neurological exam, no evidence of urinary incontinence or retention, pain is  consistently reproducible. Patient can walk but states is painful.  No loss of bowel or bladder control.  No concern for cauda equina.   Pain treated here in the department with adequate improvement. RICE protocol and pain medicine indicated and discussed with patient. I have also discussed reasons to return immediately to the ER.  Patient expresses understanding and agrees with plan.  Discussed results, findings, treatment and follow up. Patient advised of return precautions. Patient verbalized understanding and agreed with plan.  [place] Controlled Substance reporting System queried  Final Clinical Impression(s) / ED Diagnoses Final diagnoses:  Acute left-sided low back pain with left-sided sciatica    Rx / DC  Orders ED Discharge Orders         Ordered    oxyCODONE-acetaminophen (PERCOCET/ROXICET) 5-325 MG tablet  Every 6 hours PRN        02/23/21 1252    methocarbamol (ROBAXIN) 500 MG tablet  2 times daily PRN        02/23/21 1252    predniSONE (DELTASONE) 20 MG tablet  Daily        02/23/21 1252           Boss Danielsen, [place] N, [place] 02/23/21 1253    Sherwood Gambler, MD 02/25/21 1540

## 2021-02-23 NOTE — ED Triage Notes (Signed)
Pt started having lower back spasms since Sunday. States has been worsening since. Her right leg gave out at work and she fell against wall. Denies other injuries. Has hx of back spasms. Pain worse with movement

## 2021-02-23 NOTE — ED Notes (Signed)
Pt up to BR, pt gait unsteady and continues to report pain to lower back and stiffness.  States pain is less now but continues to worsen with movement

## 2021-02-23 NOTE — Discharge Instructions (Addendum)
Please read instructions below.  You can take 600 mg of ibuprofen every 6 hours as needed for pain.   Apply ice to your back for 20 minutes at a time.  You can also apply heat if this provides more relief.   You can take robaxin every 8-12 hours as needed for muscle spasm.  Be aware this medication can make you drowsy; do not take while driving or drinking alcohol.   You can take the oxycodone every 6 hours as needed for more severe pain.  Be aware this medication can make you drowsy. Do not operate motor vehicles or drink alcohol while taking this medication.  There is also 325 mg of Tylenol per tab. Starting tomorrow, take the prednisone once daily until gone. You can also try Salonpas with lidocaine patches over-the-counter. Follow-up with your primary care provider. Return to ER if new numbness in your arms or legs, inability to urinate, inability to hold your bowels, or weakness in your extremities.

## 2021-02-24 ENCOUNTER — Telehealth: Payer: Self-pay

## 2021-02-24 NOTE — Telephone Encounter (Signed)
Patient calls nurse line regarding continued intense back pain. Patient reports that even with pain medicine, prednisone and robaxin she is still having a lot of pain. Reports that pain radiates into right hip. Denies radiation to stomach. Denies fever. Denies any known injury.   Offered to schedule patient follow up visit in clinic tomorrow. Patient is unable to schedule appointment due to work schedule. Advised patient to seek care in UC or ED if pain continues to be persistent or worsening.   Patient verbalizes understanding.   Talbot Grumbling, RN

## 2021-02-25 NOTE — Telephone Encounter (Signed)
Noted and agreed thanks Hannah. ?

## 2021-03-01 ENCOUNTER — Ambulatory Visit (INDEPENDENT_AMBULATORY_CARE_PROVIDER_SITE_OTHER): Payer: Medicaid Other | Admitting: Family Medicine

## 2021-03-01 ENCOUNTER — Other Ambulatory Visit: Payer: Self-pay

## 2021-03-01 VITALS — BP 112/80 | HR 72

## 2021-03-01 DIAGNOSIS — M5441 Lumbago with sciatica, right side: Secondary | ICD-10-CM | POA: Diagnosis not present

## 2021-03-01 MED ORDER — METHOCARBAMOL 500 MG PO TABS: 500.0000 mg | ORAL_TABLET | Freq: Two times a day (BID) | ORAL | 0 refills | Status: DC | PRN

## 2021-03-01 MED ORDER — MELOXICAM 7.5 MG PO TABS: 7.5000 mg | ORAL_TABLET | Freq: Every day | ORAL | 0 refills | Status: DC

## 2021-03-01 NOTE — Patient Instructions (Signed)
Your right lower back pain is likely due to a muscle spasm creating nerve pain down your right leg.  Please continue Robaxin to relax your muscles.  Start meloxicam do not take with ibuprofen.  Can continue yoga as tolerated.  I have referred you to physical therapy please call us if you do not hear from them within a week.  Follow-up if symptoms fail to improve or worsen.  [place], DO 03/01/2021, 9:38 AM PGY-2, [place]

## 2021-03-01 NOTE — Progress Notes (Addendum)
    SUBJECTIVE:   CHIEF COMPLAINT / HPI:   Ms. Kinkade is a 25 yo F who presents to [facility] for the issues below.   Right Low back Pain 1 week prior was seen in ED s/p fall due to losing balance from right low back pain. Denies loss of consciousness. Was given muscle relaxer, steroid, and pain medicine. She would like refill on muscle relaxer. Experiencing continued pain and decreased mobility. Admits to pain in lower back that radiates down right thigh and across to left back; feels like a tingling pain. Worse with standing up straight. Better with bending forward. Currently taking ibuprofen 500 mg without relief. Expresses she does yoga a lot. Denies any trauma to the area.   PERTINENT  PMH / PSH: Osteoid osteoma of right femur, Back spasms  OBJECTIVE:   BP 112/80   Pulse 72   LMP 02/18/2021   SpO2 98%   General: Appears to be in pain, mild acute distress. Age appropriate. Antalgic gait favoring right leg. Able to get on exam table. Respiratory: normal effort MSK: Generalized pain to palpation of right lower back; hypertonic musculature appreciated. No point tenderness. No CVA tenderness. Decreased passive ROM in extension; flexion unaffected. Antalgic truncal rotation. Negative SLR.  Skin: Warm and dry, no rashes noted Neuro: alert and oriented, no focal deficits. Intact sensation of lower limbs bilaterally.  Psych: normal affect   ASSESSMENT/PLAN:   Acute right-sided low back pain with right-sided sciatica 1 week ongoing right low back pain with sciatic nerve pain likely due to hypertonic muscles. Possibly precipitated from improper positioning or over use with yoga exercises. Low suspicion for fracture or disc location without evidence of or known trauma.  -Refill robaxin -Start meloxicam; do not take with ibuprofen -Referral to PT -Can continue light yoga stretching as tolerated -Follow up w/ PCP if symptoms worsen or fail to improve; consider imaging w/ failure to resolve    Gerlene Fee, [place]   *Encounter discussed fellow.

## 2021-03-01 NOTE — Assessment & Plan Note (Addendum)
1 week ongoing right low back pain with sciatic nerve pain likely due to hypertonic muscles. Possibly precipitated from improper positioning or over use with yoga exercises. Low suspicion for fracture or disc location without evidence of or known trauma.  -Refill robaxin -Start meloxicam; do not take with ibuprofen -Referral to PT -Can continue light yoga stretching as tolerated -Follow up w/ PCP if symptoms worsen or fail to improve; consider imaging w/ failure to resolve

## 2021-03-02 NOTE — Telephone Encounter (Signed)
*  Delay in documentation.   Attempted to call patient on 4/22. LVM for patient to return call to office to gather more information.   Patient returns call to nurse line. Patient reports that she is not having any symptoms at this time.   Medication does not need to be sent to pharmacy.   Talbot Grumbling, RN

## 2021-03-03 ENCOUNTER — Other Ambulatory Visit: Payer: Self-pay

## 2021-03-03 ENCOUNTER — Emergency Department (HOSPITAL_COMMUNITY): Payer: Medicaid Other

## 2021-03-03 ENCOUNTER — Emergency Department (HOSPITAL_COMMUNITY)
Admission: EM | Admit: 2021-03-03 | Discharge: 2021-03-03 | Disposition: A | Discharge status: Home / Self Care | Payer: Medicaid Other | Attending: Emergency Medicine | Admitting: Emergency Medicine

## 2021-03-03 ENCOUNTER — Ambulatory Visit (INDEPENDENT_AMBULATORY_CARE_PROVIDER_SITE_OTHER): Payer: Medicaid Other | Admitting: Family Medicine

## 2021-03-03 ENCOUNTER — Encounter: Payer: Self-pay | Admitting: Family Medicine

## 2021-03-03 VITALS — BP 125/71 | Ht 64.0 in

## 2021-03-03 DIAGNOSIS — F17210 Nicotine dependence, cigarettes, uncomplicated: Secondary | ICD-10-CM | POA: Diagnosis not present

## 2021-03-03 DIAGNOSIS — D72829 Elevated white blood cell count, unspecified: Secondary | ICD-10-CM | POA: Diagnosis not present

## 2021-03-03 DIAGNOSIS — M5127 Other intervertebral disc displacement, lumbosacral region: Secondary | ICD-10-CM | POA: Diagnosis not present

## 2021-03-03 DIAGNOSIS — M549 Dorsalgia, unspecified: Secondary | ICD-10-CM | POA: Diagnosis present

## 2021-03-03 DIAGNOSIS — M5126 Other intervertebral disc displacement, lumbar region: Secondary | ICD-10-CM | POA: Diagnosis present

## 2021-03-03 LAB — BASIC METABOLIC PANEL
Anion gap: 5 (ref 5–15)
BUN: 14 mg/dL (ref 6–20)
CO2: 23 mmol/L (ref 22–32)
Calcium: 8.5 mg/dL — ABNORMAL LOW (ref 8.9–10.3)
Chloride: 110 mmol/L (ref 98–111)
Creatinine, Ser: 0.62 mg/dL (ref 0.44–1.00)
GFR, Estimated: >60 mL/min (ref >60)
Glucose, Bld: 98 mg/dL (ref 70–99)
Potassium: 3.8 mmol/L (ref 3.5–5.1)
Sodium: 138 mmol/L (ref 135–145)

## 2021-03-03 LAB — CBC WITH DIFFERENTIAL/PLATELET
Abs Immature Granulocytes: 0.04 10*3/uL (ref 0.00–0.07)
Basophils Absolute: 0 10*3/uL (ref 0.0–0.1)
Basophils Relative: 0 %
Eosinophils Absolute: 0.1 10*3/uL (ref 0.0–0.5)
Eosinophils Relative: 1 %
HCT: 37.2 % (ref 36.0–46.0)
Hemoglobin: 11.9 g/dL — ABNORMAL LOW (ref 12.0–15.0)
Immature Granulocytes: 0 %
Lymphocytes Relative: 27 %
Lymphs Abs: 3 10*3/uL (ref 0.7–4.0)
MCH: 30.7 pg (ref 26.0–34.0)
MCHC: 32 g/dL (ref 30.0–36.0)
MCV: 95.9 fL (ref 80.0–100.0)
Monocytes Absolute: 0.7 10*3/uL (ref 0.1–1.0)
Monocytes Relative: 6 %
Neutro Abs: 7.4 10*3/uL (ref 1.7–7.7)
Neutrophils Relative %: 66 %
Platelets: 344 10*3/uL (ref 150–400)
RBC: 3.88 MIL/uL (ref 3.87–5.11)
RDW: 12.8 % (ref 11.5–15.5)
WBC: 11.3 10*3/uL — ABNORMAL HIGH (ref 4.0–10.5)
nRBC: 0 % (ref 0.0–0.2)

## 2021-03-03 LAB — I-STAT BETA HCG BLOOD, ED (MC, WL, AP ONLY): I-stat hCG, quantitative: <5 m[IU]/mL (ref <5)

## 2021-03-03 MED ORDER — SODIUM CHLORIDE 0.9 % IV BOLUS
1000.0000 mL | Freq: Once | INTRAVENOUS | Status: AC
  Administered 2021-03-03: 1000 mL via INTRAVENOUS

## 2021-03-03 MED ORDER — CYCLOBENZAPRINE HCL 10 MG PO TABS: 10.0000 mg | ORAL_TABLET | Freq: Two times a day (BID) | ORAL | 0 refills | Status: DC | PRN

## 2021-03-03 MED ORDER — HYDROMORPHONE HCL 1 MG/ML IJ SOLN
0.5000 mg | Freq: Once | INTRAMUSCULAR | Status: AC
  Administered 2021-03-03: 0.5 mg via INTRAVENOUS
  Filled 2021-03-03: qty 1

## 2021-03-03 MED ORDER — CYCLOBENZAPRINE HCL 10 MG PO TABS
5.0000 mg | ORAL_TABLET | Freq: Once | ORAL | Status: AC
  Administered 2021-03-03: 5 mg via ORAL
  Filled 2021-03-03: qty 1

## 2021-03-03 MED ORDER — KETOROLAC TROMETHAMINE 30 MG/ML IJ SOLN
30.0000 mg | Freq: Once | INTRAMUSCULAR | Status: AC
  Administered 2021-03-03: 30 mg via INTRAVENOUS
  Filled 2021-03-03: qty 1

## 2021-03-03 MED ORDER — MORPHINE SULFATE (PF) 4 MG/ML IV SOLN
4.0000 mg | Freq: Once | INTRAVENOUS | Status: AC
  Administered 2021-03-03: 4 mg via INTRAVENOUS
  Filled 2021-03-03: qty 1

## 2021-03-03 MED ORDER — LIDOCAINE 5 % EX PTCH: 1.0000 | MEDICATED_PATCH | CUTANEOUS | 0 refills | Status: DC

## 2021-03-03 MED ORDER — OXYCODONE HCL 5 MG PO TABS: 5.0000 mg | ORAL_TABLET | Freq: Four times a day (QID) | ORAL | 0 refills | Status: AC | PRN

## 2021-03-03 MED ORDER — ONDANSETRON HCL 4 MG/2ML IJ SOLN
4.0000 mg | Freq: Once | INTRAMUSCULAR | Status: AC
  Administered 2021-03-03: 4 mg via INTRAVENOUS
  Filled 2021-03-03: qty 2

## 2021-03-03 NOTE — ED Notes (Signed)
Pt transported to MRI 

## 2021-03-03 NOTE — ED Provider Notes (Signed)
[facility] EMERGENCY DEPARTMENT Provider Note   CSN: [id number] Arrival date & time: 03/03/21  0909     History Chief Complaint  Patient presents with  . Back Pain    Miranda Fischer is a 25 y.o. 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  This is the fourth time, per patient that she has been evaluated for this.  Initially by ED as well as PCP x2 according to patient.  Patient states she was given pain medicine, muscle relaxers and steroids.  Patient states her pain has been significantly worsening.  She is unable to perform ADLs.  Pain goes across her back, starts at her midline back and goes down her right leg.  Patient states she has fallen multiple times due to the pain.  Feels like she is weak in her right leg secondary to pain.  She denies hitting her head, LOC or anticoagulation.  States she was unable to get off the floor this morning after fall and subsequently her son called EMS to help.  Symptoms started 2-3 weeks ago.  States she does do yoga at baseline.  She has never had any pain like this previously.  Denies IV drug use, bowel or bladder incontinence, saddle paresthesia.  Rates her pain a 10/10.  No fever, chills, nausea, vomiting, chest pain, shortness of breath, abdominal pain, dysuria, hematuria.  Denies additional aggravating or alleviating factors.  History obtained from patient and past medical records.  No interpreter is used.  HPI     Past Medical History:  Diagnosis Date  . Back spasm 02/28/2020    Patient Active Problem List   Diagnosis Date Noted  . Acute right-sided low back pain with right-sided sciatica 03/01/2021  . Vaginal discharge 01/15/2020  . Screening examination for STD (sexually transmitted disease) 12/31/2019  . Screening for cervical cancer 12/31/2019  . At risk for anemia 12/13/2019  . Mental health problem 12/13/2019  . Bacterial  vaginosis 11/11/2019  . Abnormal TSH 11/11/2019  . 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   OB History   No obstetric history on file.     Family History  Family history unknown: Yes    Social History   Tobacco Use  . Smoking status: Current Every Day Smoker    Types: Cigarettes  . Smokeless tobacco: Never Used  . Tobacco comment: Black and milds  Vaping Use  . Vaping Use: Never used  Substance Use Topics  . Alcohol use: Yes    Comment: occasionally  . Drug use: Yes    Types: Marijuana    Comment: socially    Home Medications Prior to Admission medications   Medication Sig Start Date End Date Taking? Authorizing Provider  cyclobenzaprine (FLEXERIL) 10 MG tablet Take 1 tablet (10 mg total) by mouth 2 (two) times daily as needed for muscle spasms. 03/03/21  Yes Calleigh Lafontant A, PA-C  lidocaine (LIDODERM) 5 % Place 1 patch onto the skin daily. Remove & Discard patch within 12 hours or as directed by MD 03/03/21  Yes Anniston Nellums A, PA-C  oxyCODONE (ROXICODONE) 5 MG immediate release tablet Take 1 tablet (5 mg total) by mouth every 6 (six) hours as needed for up to 3 days for severe pain. 03/03/21 03/06/21 Yes Hisae Decoursey A, PA-C  fluconazole (DIFLUCAN) 150 MG tablet Take 1 tablet (150 mg total) by mouth daily.  Repeat in 1 week if needed 12/15/20   Raylene Everts, MD  meloxicam (MOBIC) 7.5 MG tablet Take 1 tablet (7.5 mg total) by mouth daily. 03/01/21   Autry-Lott, Naaman Plummer, DO  methocarbamol (ROBAXIN) 500 MG tablet Take 1 tablet (500 mg total) by mouth 2 (two) times daily as needed for muscle spasms. 03/01/21   Autry-Lott, Naaman Plummer, DO  metroNIDAZOLE (FLAGYL) 500 MG tablet Take 1 tablet (500 mg total) by mouth 2 (two) times daily. 12/15/20   Raylene Everts, MD  metroNIDAZOLE (METROGEL) 0.75 % vaginal gel INSERT 1 APPLICATORFUL VAGINALLY AT BEDTIME FOR 7 DAYS 02/09/21   Lattie Haw, MD  oxyCODONE-acetaminophen (PERCOCET/ROXICET)  5-325 MG tablet Take 1-2 tablets by mouth every 6 (six) hours as needed for severe pain. 02/23/21   Robinson, Martin[place] N, PA-C    Allergies    Penicillins and Shellfish allergy  Review of Systems   Review of Systems  Constitutional: Negative.   HENT: Negative.   Respiratory: Negative.   Cardiovascular: Negative.   Gastrointestinal: Negative.   Genitourinary: Negative.   Musculoskeletal: Positive for back pain.  Skin: Negative.   Neurological: Negative.   All other systems reviewed and are negative.   Physical Exam Updated Vital Signs BP 126/75   Pulse (!) 50   Temp 98.8 F (37.1 C)   Resp 16   Ht 5\' 6"  (1.676 m)   Wt 93 kg   LMP 02/18/2021   SpO2 96%   BMI 33.09 kg/m   Physical Exam Physical Exam  Constitutional: Pt appears well-developed and well-nourished. No distress.  HENT:  Head: Normocephalic and atraumatic.  Mouth/Throat: Oropharynx is clear and moist. No oropharyngeal exudate.  Eyes: Conjunctivae are normal.  Neck: Normal range of motion. Neck supple.  Full ROM without pain  Cardiovascular: Normal rate, regular rhythm and intact distal pulses.   Pulmonary/Chest: Effort normal and breath sounds normal. No respiratory distress. Pt has no wheezes.  Abdominal: Soft. Pt exhibits no distension. There is no tenderness, rebound or guarding. No abd bruit or pulsatile mass Musculoskeletal:  Full range of motion of the T-spine and L-spine with flexion, hyperextension, and lateral flexion. No midline tenderness or stepoffs. No tenderness to palpation of the spinous processes of the T-spine or L-spine. Moderate tenderness to palpation of the paraspinous muscles of the L-spine.  Diffuse tenderness to right SI, piriformis Patient unable to lift legs off bed, question due to pain Able to plantarflex and dorsiflex without difficulty Lymphadenopathy:    Pt has no cervical adenopathy.  Neurological: Pt is alert. Pt has normal reflexes.  Reflex Scores:      Bicep reflexes are  2+ on the right side and 2+ on the left side.      Brachioradialis reflexes are 2+ on the right side and 2+ on the left side.      Patellar reflexes are 2+ on the right side and 2+ on the left side.      Achilles reflexes are 2+ on the right side and 2+ on the left side. Speech is clear and goal oriented, follows commands Normal 5/5 strength in upper and lower extremities bilaterally including dorsiflexion and plantar flexion, strong and equal grip strength Sensation normal to light and sharp touch Moves extremities without ataxia, coordination intact No Clonus Skin: Skin is warm and dry. No rash noted or lesions noted. Pt is not diaphoretic. No erythema, ecchymosis,edema or warmth.  Psychiatric: Pt has a normal mood and affect. Behavior is normal.  Nursing note and vitals reviewed.  ED Results / Procedures / Treatments   Labs (all labs ordered are listed, but only abnormal results are displayed) Labs Reviewed  CBC WITH DIFFERENTIAL/PLATELET - Abnormal; Notable for the following components:      Result Value   WBC 11.3 (*)    Hemoglobin 11.9 (*)    All other components within normal limits  BASIC METABOLIC PANEL - Abnormal; Notable for the following components:   Calcium 8.5 (*)    All other components within normal limits  I-STAT BETA HCG BLOOD, ED ([facility], [facility], [facility] ONLY)    EKG None  Radiology MR LUMBAR SPINE WO CONTRAST  Result Date: 03/03/2021 CLINICAL DATA:  Low back pain. EXAM: MRI LUMBAR SPINE WITHOUT CONTRAST TECHNIQUE: Multiplanar, multisequence MR imaging of the lumbar spine was performed. No intravenous contrast was administered. COMPARISON:  None. FINDINGS: Segmentation:  Standard. Alignment:  Physiologic. Vertebrae:  No fracture, evidence of discitis, or bone lesion. Conus medullaris and cauda equina: Conus extends to the L2 level. Conus and cauda equina appear normal. Paraspinal and other soft tissues: Negative. Disc levels: T12-L1:No spinal canal or neural foraminal stenosis.  L1-2:No spinal canal or neural foraminal stenosis. L2-3:No spinal canal or neural foraminal stenosis. L3-4:No spinal canal or neural foraminal stenosis. L4-5:No spinal canal or neural foraminal stenosis. L5-S1:Tiny right central disc protrusion with associated annular tear, causing mild narrowing of the right subarticular zones and abutting the traversing right S1 nerve root without displacement. Mild facet degenerative changes. No significant spinal canal or neural foraminal stenosis. IMPRESSION: 1. Tiny right central disc protrusion at L5-S1, causing mild narrowing of the right subarticular zones and abutting the traversing right S1 nerve root without displacement. 2. No significant spinal canal or neural foraminal stenosis at any level. Electronically Signed   By: Pedro Earls M.D.   On: 03/03/2021 12:08    Procedures Procedures   Medications Ordered in ED Medications  morphine 4 MG/ML injection 4 mg (4 mg Intravenous Given 03/03/21 1017)  ondansetron (ZOFRAN) injection 4 mg (4 mg Intravenous Given 03/03/21 1017)  sodium chloride 0.9 % bolus 1,000 mL (0 mLs Intravenous Stopped 03/03/21 1340)  cyclobenzaprine (FLEXERIL) tablet 5 mg (5 mg Oral Given 03/03/21 1201)  ketorolac (TORADOL) 30 MG/ML injection 30 mg (30 mg Intravenous Given 03/03/21 1259)  HYDROmorphone (DILAUDID) injection 0.5 mg (0.5 mg Intravenous Given 03/03/21 1301)    ED Course  I have reviewed the triage vital signs and the nursing notes.  Pertinent labs & imaging results that were available during my care of the patient were reviewed by me and considered in my medical decision making (see chart for details).  25 year old here for evaluation of back pain.  Began 2 to 3 weeks ago.  She is afebrile, nonseptic, non-ill-appearing.  Has had multiple falls since due to pain.  Was unable to get off the floor secondary to pain this morning.  Patient states this is her fourth visit she has been seen for this.  Pain is going down  her right leg.  No history of IV drug use, bowel or bladder incontinence, saddle paresthesia.  She has intact sensation however has significantly decreased range of motion is unable to lift right leg off bed, question if this is more so due to pain versus actual neurologic deficit.  Does have history of prior bony tumor.  Given this we will obtain MR imaging.  We will treat for pain in the meantime.  Labs and imaging personally reviewed and interpreted:  Preg negative CBC leukocytosis  at 11.3, Hgb 11.9 similar to prior BMP calcium 8.5 no additional electrolyte, renal or liver abnormality MR lumbar tiny L5-S1 disc protrusion.  No evidence of cauda equina.  Patient reassessed. Still has significant pain.  Reassuring MRI.  Nonfocal neuro exam without deficits now.  Will try additional dose of pain medication.  Will treat with NSAIDs, pain management, lidocaine patches, switch her muscle relaxer.  She will need follow-up with PCP.  Given back exercises pamphlet.  Encouraged to follow-up with the PT her PCP had sent a referral in for her.  At this time I have low suspicion for acute neurosurgical emergency such as cauda equina, discitis, osteomyelitis, transverse myelitis, abscess or intra abdominal/ pelvic etiology of symptoms.   The patient has been appropriately medically screened and/or stabilized in the ED. I have low suspicion for any other emergent medical condition which would require further screening, evaluation or treatment in the ED or require inpatient management.  Patient is hemodynamically stable and in no acute distress.  Patient able to ambulate in department prior to ED.  Evaluation does not show acute pathology that would require ongoing or additional emergent interventions while in the emergency department or further inpatient treatment.  I have discussed the diagnosis with the patient and answered all questions.  Pain is been managed while in the emergency department and patient has  no further complaints prior to discharge.  Patient is comfortable with plan discussed in room and is stable for discharge at this time.  I have discussed strict return precautions for returning to the emergency department.  Patient was encouraged to follow-up with PCP/specialist refer to at discharge.    MDM Rules/Calculators/A&P                           Final Clinical Impression(s) / ED Diagnoses Final diagnoses:  Herniation of intervertebral disc between L5 and S1    Rx / DC Orders ED Discharge Orders         Ordered    cyclobenzaprine (FLEXERIL) 10 MG tablet  2 times daily PRN        03/03/21 1251    oxyCODONE (ROXICODONE) 5 MG immediate release tablet  Every 6 hours PRN        03/03/21 1251    lidocaine (LIDODERM) 5 %  Every 24 hours        03/03/21 1251           Conita Amenta A, PA-C 03/03/21 1415    Davonna Belling, MD 03/03/21 1647

## 2021-03-03 NOTE — Progress Notes (Addendum)
      SUBJECTIVE:   CHIEF COMPLAINT / HPI:   Back pain  Patient presenting after being seen in the emergency department in the last couple of hours.  Patient reports that her back pain started on 02/23/2021 after a fall.  She presented to [facility] emergency department with acute lower back pain.  She was provided oxycodone and Robaxin which helped the pain.  The pain continued and she presented to Dr. Janus Molder on 03/01/2021 and provided meloxicam with referral to physical therapy.  Patient reports that pain has continued to be severe so she presented to the ED today.  MRI showed mild right-sided herniation of L5-S1.  She was discharged with oxycodone and Flexeril.  She is presenting for further management. She reports that she is going to graduate from [employer], but does not know she will be able to attend due to the pain.   PERTINENT  PMH / PSH: No previous back pain or chronic pain issues  OBJECTIVE:   BP 125/71   Ht 5\' 4"  (1.626 m)   LMP 02/18/2021   BMI 35.19 kg/m   Nontoxic female.  Mild to moderate distress throughout exam.  She is tearful due to pain.  She is bent over exam table and appears uncomfortable. No obvious gross abnormality of lower back/lower extremties. Sensations intact throughout. Right LE ROM grossly limited by pain, but hip & knee flexion /extension intact b/l. Intact plantar/dorsiflexion. Strength 5/5 of bilateral LE. Patient presented in wheelchair, but is able to walk out of office, though slowey with antalgic gait of right side.   ASSESSMENT/PLAN:   Herniated lumbar disc without myelopathy Found on MRI in the ED today.  Patient continues to have considerable pain.  She was provided with oxycodone and Flexeril from the ED today.  She would like to know what she can do further for pain.  PDMP was reviewed and no red flags.  I would be happy to see this patient on Monday to follow-up with her pain and determine appropriate management at that time.   We spoke in depth about opioid crisis and concerns with prescribing large amounts of narcotics.  She is understanding and very appropriate.  Counseled patient on other treatment of herniated disc.  She would like referral to [facility] for potential epidural injection.  She is scheduled with physical therapy, but does not start until the end of May.  [place] desk is closed at this time. Will message [facility] admin to add this patient to virtual visit at 11:30 on Monday 03/07/21   Wilber Oliphant, MD [place]

## 2021-03-03 NOTE — ED Triage Notes (Signed)
Pt to ED via EMS from home c/o back pain/lower back/sciatica. Reports pain shoots down right leg. Pt slid down wall today to floor secondary to pain, Pt did not head, No LOC, No injury. Currently followed by PCP for same. Currently taking muscle relaxer, pain meds, and prednisone, Reports no relief. Symptoms initially started 2 weeks ago. Last VS: 112/70, P60, [id number]. No other medical history.

## 2021-03-03 NOTE — Discharge Instructions (Signed)
It was our pleasure taking care of you here in the emergency department today  Your MRI showed a tiny herniated disc between L5-S1.  Continue taking the Mobic prescribed to you by your primary care provider.  I will switch her muscle relaxer.  Do not take the Robaxin.  I have given you a short course of pain medicine.  Do not drive or operate heavy machinery while taking this medication.  This medication may become addictive.  Follow-up outpatient with your primary care provider.

## 2021-03-03 NOTE — ED Notes (Signed)
Pt d/c home per MD order. Discharge summary reviewed with pt, pt verbalizes understanding. Reports her friend is discharge ride home.

## 2021-03-03 NOTE — Assessment & Plan Note (Addendum)
Found on MRI in the ED today.  Patient continues to have considerable pain.  She was provided with oxycodone and Flexeril from the ED today.  She would like to know what she can do further for pain.  PDMP was reviewed and no red flags.  I would be happy to see this patient on Monday to follow-up with her pain and determine appropriate management at that time.  We spoke in depth about opioid crisis and concerns with prescribing large amounts of narcotics.  She is understanding and very appropriate.  Counseled patient on other treatment of herniated disc.  She would like referral to [facility] for potential epidural injection.  She is scheduled with physical therapy, but does not start until the end of May.  [place] desk is closed at this time. Will message [facility] admin to add this patient to virtual visit at 11:30 on Monday 03/07/21

## 2021-03-07 ENCOUNTER — Encounter: Payer: Self-pay | Admitting: Family Medicine

## 2021-03-07 ENCOUNTER — Telehealth: Payer: Medicaid Other | Admitting: Emergency Medicine

## 2021-03-07 ENCOUNTER — Telehealth (INDEPENDENT_AMBULATORY_CARE_PROVIDER_SITE_OTHER): Payer: Medicaid Other | Admitting: Family Medicine

## 2021-03-07 ENCOUNTER — Other Ambulatory Visit: Payer: Self-pay

## 2021-03-07 DIAGNOSIS — M5126 Other intervertebral disc displacement, lumbar region: Secondary | ICD-10-CM

## 2021-03-07 DIAGNOSIS — M5441 Lumbago with sciatica, right side: Secondary | ICD-10-CM

## 2021-03-07 MED ORDER — LIDOCAINE 5 % EX PTCH: 1.0000 | MEDICATED_PATCH | CUTANEOUS | 0 refills | Status: DC

## 2021-03-07 MED ORDER — OXYCODONE-ACETAMINOPHEN 5-325 MG PO TABS: 1.0000 | ORAL_TABLET | ORAL | 0 refills | Status: DC | PRN

## 2021-03-07 MED ORDER — CYCLOBENZAPRINE HCL 10 MG PO TABS: 10.0000 mg | ORAL_TABLET | Freq: Two times a day (BID) | ORAL | 0 refills | Status: DC | PRN

## 2021-03-07 NOTE — Progress Notes (Signed)
    SUBJECTIVE:   CHIEF COMPLAINT / HPI:  Spoke to patient over the phone as we could not connect to video due to technical issues.   Follow up Lumbar disc herniation.  Overall, pain has not improved. Pain improves with pain medication. The more active she is, the less pain she feels. But it does become difficult to continue to move when the pain medication wears off. She 5 mg oxycodone lasts for 3 hours. Out of percocet. Taking Flexeril 10 mg twice a day which is helping (helping more than robaxin). Ibuprofen 400 mg q 6 hours.  Denies low extremity weakness, urinary retention, bladder or bowel incontinence, saddle anesthesia, or other lower extremity neurological abnormalities.   PERTINENT  PMH / PSH: Lumbar disc herniation   OBJECTIVE:   Ht 5\' 6"  (1.676 m)   Wt 205 lb (93 kg) Comment: Pt reported, [facility] Visit  LMP 02/18/2021   BMI 33.09 kg/m   No respiratory distress, speaking in full sentences. When she is moving around trying to find things, she is grunting in pain.   ASSESSMENT/PLAN:   Herniated lumbar disc without myelopathy Patient denies any red flag symptoms at this time.  Patient continues to have pain.  I will send patient Percocet #30, refill Flexeril.  Patient should continue taking ibuprofen.  Counseled patient on taking ibuprofen precautions.  Her referral to neurosurgery has gone through.  I provided her their phone number to call them to make the appointment.  I have also encouraged her to call physical therapy to see if she can get in sooner.  She is currently not working so that helps with her schedule flexibility.  Provided emergency precautions for lower extremity weakness, urinary retention, incontinence or saddle anesthesia.  Patient understanding. Patient to follow-up as needed for pain   Wilber Oliphant, MD [place]

## 2021-03-07 NOTE — Progress Notes (Signed)
Based on what you shared with me, I feel your condition warrants further evaluation and I recommend that you be seen in a face to face office visit.   Back pain with weakness can be true emergencies.  You'll need an in-person exam to check reflexes and strength testing.   Sorry, e-visits will not be able to adequately assess or treat your condition.   NOTE: If you entered your credit card information for this eVisit, you will not be charged. You may see a "hold" on your card for the $35 but that hold will drop off and you will not have a charge processed.   If you are having a true medical emergency please call 911.      For an urgent face to face visit, [facility] has six urgent care centers for your convenience:     [facility] [place] at  Get Driving Directions [phone] [address], [place] [address] . 8 am - 4 pm Monday - Friday    [facility]) Get Driving Directions [phone] [address] [place], [place] [address] . 8 am to 8 pm Monday-Friday . 10 am to 6 pm [facility]) Get Driving Directions [phone]  [address] [place],  [place]  [address] . 8 am to 8 pm Monday-Friday . 8 am to 4 pm [facility] at [facility] Get Driving Directions [phone] [place], [place], [place] [address] . 8 am to 8 pm Monday-Friday . 8 am to 4 pm [facility] at [facility] Get Driving Directions  [phone] [address] [place], [place] [address] . 8 am to 8 pm Monday-Friday . 8 am to 4 pm [facility] at [place] Get Driving Directions [phone] [address], [place],  [address] . 8 am to 8 pm Monday-Friday . 8 am to 4 pm Saturday-Sunday     Your MyChart  E-visit questionnaire answers were reviewed by a board certified advanced clinical practitioner to complete your personal care plan based on your specific symptoms.  Thank you for using e-Visits.    Approximately 5 minutes was used in reviewing the patient's chart, questionnaire, prescribing medications, and documentation.

## 2021-03-07 NOTE — Assessment & Plan Note (Signed)
Patient denies any red flag symptoms at this time.  Patient continues to have pain.  I will send patient Percocet #30, refill Flexeril.  Patient should continue taking ibuprofen.  Counseled patient on taking ibuprofen precautions.  Her referral to neurosurgery has gone through.  I provided her their phone number to call them to make the appointment.  I have also encouraged her to call physical therapy to see if she can get in sooner.  She is currently not working so that helps with her schedule flexibility.  Provided emergency precautions for lower extremity weakness, urinary retention, incontinence or saddle anesthesia.  Patient understanding. Patient to follow-up as needed for pain

## 2021-03-09 ENCOUNTER — Telehealth: Payer: Self-pay

## 2021-03-09 ENCOUNTER — Telehealth: Payer: Medicaid Other | Admitting: Family Medicine

## 2021-03-09 NOTE — Telephone Encounter (Signed)
Received fax from pharmacy, PA needed on Lidoderm Patches 5%.  Clinical questions submitted via Cover My Meds.  Waiting on response, could take up to 72 hours.  Cover My Meds info: Key: [id number]  Talbot Grumbling, RN

## 2021-03-09 NOTE — Telephone Encounter (Signed)
Please see below determination for Lidocaine patches.      Called approval into pharmacy. Called patient to provide update. Patient did not answer. Left HIPAA compliant VM for patient to return call to office.   Talbot Grumbling, RN

## 2021-03-11 NOTE — Progress Notes (Signed)
[facility] Telemedicine Visit  Patient consented to have virtual visit and was identified by name and date of birth. Method of visit: Telephone  Encounter participants: Patient: Miranda Fischer - located at home Provider: Wilber Oliphant - located at [facility] Others (if applicable):   Time spent during visit with patient: 15 minutes

## 2021-03-14 ENCOUNTER — Ambulatory Visit (HOSPITAL_COMMUNITY)
Admission: EM | Admit: 2021-03-14 | Discharge: 2021-03-14 | Disposition: A | Discharge status: Home / Self Care | Payer: Medicaid Other | Attending: Internal Medicine | Admitting: Internal Medicine

## 2021-03-14 ENCOUNTER — Encounter (HOSPITAL_COMMUNITY): Payer: Self-pay

## 2021-03-14 ENCOUNTER — Other Ambulatory Visit: Payer: Self-pay

## 2021-03-14 ENCOUNTER — Ambulatory Visit (HOSPITAL_COMMUNITY)
Admission: EM | Admit: 2021-03-14 | Discharge: 2021-03-14 | Disposition: A | Discharge status: Home / Self Care | Payer: Medicaid Other

## 2021-03-14 VITALS — BP 124/80 | HR 82 | Temp 98.9°F | Resp 18

## 2021-03-14 DIAGNOSIS — N898 Other specified noninflammatory disorders of vagina: Secondary | ICD-10-CM | POA: Insufficient documentation

## 2021-03-14 DIAGNOSIS — Z113 Encounter for screening for infections with a predominantly sexual mode of transmission: Secondary | ICD-10-CM | POA: Diagnosis not present

## 2021-03-14 LAB — POCT URINALYSIS DIPSTICK, ED / UC
Bilirubin Urine: NEGATIVE
Glucose, UA: NEGATIVE mg/dL
Hgb urine dipstick: NEGATIVE
Ketones, ur: NEGATIVE mg/dL
Leukocytes,Ua: NEGATIVE
Nitrite: NEGATIVE
Protein, ur: NEGATIVE mg/dL
Specific Gravity, Urine: 1.025 (ref 1.005–1.030)
Urobilinogen, UA: 0.2 mg/dL (ref 0.0–1.0)
pH: 7 (ref 5.0–8.0)

## 2021-03-14 MED ORDER — FLUCONAZOLE 150 MG PO TABS: 150.0000 mg | ORAL_TABLET | Freq: Every day | ORAL | 0 refills | Status: DC

## 2021-03-14 MED ORDER — METRONIDAZOLE 500 MG PO TABS: 500.0000 mg | ORAL_TABLET | Freq: Two times a day (BID) | ORAL | 0 refills | Status: AC

## 2021-03-14 NOTE — ED Triage Notes (Signed)
Pt presents with vaginal odor and discharge for over a week with no relief with prescribed vaginal gel.

## 2021-03-14 NOTE — Discharge Instructions (Addendum)
Take medication as prescribed.  Please follow-up for any worsening or persistent symptoms. We will call you should you need any further treatment based on testing results

## 2021-03-14 NOTE — ED Provider Notes (Signed)
[facility]    CSN: [id number] Arrival date & time: 03/14/21  1240      History   Chief Complaint Chief Complaint  Patient presents with  . APPOINTMENT ; BV    HPI Miranda Fischer is a 25 y.o. female presents to urgent care today for complaints of vaginal discharge with "terrible" odor x1 week.  States she has tried vaginal gel with no relief.  States similar to previous BV infections that required p.o. treatment.  States also diagnosed recently with herniated disc and wondering if she has UTI causing low back pain.  She denies any recent fever or chills, abdominal pain, dysuria, vomiting or diarrhea.   Past Medical History:  Diagnosis Date  . Back spasm 02/28/2020    Patient Active Problem List   Diagnosis Date Noted  . Herniated lumbar disc without myelopathy 03/03/2021  . Acute right-sided low back pain with right-sided sciatica 03/01/2021  . Vaginal discharge 01/15/2020  . Screening examination for STD (sexually transmitted disease) 12/31/2019  . Screening for cervical cancer 12/31/2019  . At risk for anemia 12/13/2019  . Mental health problem 12/13/2019  . Abnormal TSH 11/11/2019  . 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 No pertinent surgical history.  OB History   No obstetric history on file.      Home Medications    Prior to Admission medications   Medication Sig Start Date End Date Taking? Authorizing Provider  fluconazole (DIFLUCAN) 150 MG tablet Take 1 tablet (150 mg total) by mouth daily. Okay to repeat in 3 days if symptoms persist 03/14/21  Yes Rudolpho Sevin, NP  metroNIDAZOLE (FLAGYL) 500 MG tablet Take 1 tablet (500 mg total) by mouth 2 (two) times daily for 7 days. 03/14/21 03/21/21 Yes Rudolpho Sevin, NP  cyclobenzaprine (FLEXERIL) 10 MG tablet Take 1 tablet (10 mg total) by mouth 2 (two) times daily as needed for muscle spasms. 03/07/21   Wilber Oliphant, MD  lidocaine (LIDODERM) 5 % Place 1 patch onto the  skin daily. Remove & Discard patch within 12 hours or as directed by MD 03/07/21   Wilber Oliphant, MD  oxyCODONE-acetaminophen (PERCOCET/ROXICET) 5-325 MG tablet Take 1-2 tablets by mouth every 4 (four) hours as needed for severe pain. 03/07/21   Wilber Oliphant, MD    Family History Family History  Family history unknown: Yes    Social History Social History   Tobacco Use  . Smoking status: Current Every Day Smoker    Types: Cigarettes  . Smokeless tobacco: Never Used  . Tobacco comment: Black and milds  Vaping Use  . Vaping Use: Never used  Substance Use Topics  . Alcohol use: Yes    Comment: occasionally  . Drug use: Yes    Types: Marijuana    Comment: socially     Allergies   Penicillins and Shellfish allergy   Review of Systems As stated hpi otherwise negative   Physical Exam Triage Vital Signs ED Triage Vitals  Enc Vitals Group     BP 03/14/21 1330 124/80     Pulse Rate 03/14/21 1330 82     Resp 03/14/21 1330 18     Temp 03/14/21 1330 98.9 F (37.2 C)     Temp Source 03/14/21 1330 Oral     SpO2 03/14/21 1330 100 %     Weight --      Height --      Head Circumference --  Peak Flow --      Pain Score 03/14/21 1332 1     Pain Loc --      Pain Edu? --      Excl. in [place]? --    No data found.  Updated Vital Signs BP 124/80 (BP Location: Left Arm)   Pulse 82   Temp 98.9 F (37.2 C) (Oral)   Resp 18   LMP 02/18/2021   SpO2 100%   Visual Acuity Right Eye Distance:   Left Eye Distance:   Bilateral Distance:    Right Eye Near:   Left Eye Near:    Bilateral Near:     Physical Exam Constitutional:      General: She is not in acute distress.    Appearance: Normal appearance. She is not ill-appearing or toxic-appearing.  HENT:     Mouth/Throat:     Mouth: Mucous membranes are moist.  Abdominal:     General: Bowel sounds are normal.     Palpations: Abdomen is soft.  Skin:    General: Skin is warm and dry.  Neurological:     General: No focal  deficit present.     Mental Status: She is alert and oriented to person, place, and time.  Psychiatric:        Thought Content: Thought content normal.     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  Procedures Procedures (including critical care time)  Medications Ordered in UC Medications - No data to display  Initial Impression / Assessment and Plan / UC Course  I have reviewed the triage vital signs and the nursing notes.  Pertinent labs & imaging results that were available during my care of the patient were reviewed by me and considered in my medical decision making (see chart for details).  Vaginal discharge -Consistent with previous BV infections per patient. U/A negative -Flagyl twice daily x7 days -STI screening  Reviewed expections re: course of current medical issues. Questions answered. Outlined signs and symptoms indicating need for more acute intervention. Pt verbalized understanding. AVS given   Final Clinical Impressions(s) / UC Diagnoses   Final diagnoses:  Vaginal discharge  Routine screening for STI (sexually transmitted infection)     Discharge Instructions     Take medication as prescribed.  Please follow-up for any worsening or persistent symptoms. We will call you should you need any further treatment based on testing results    ED Prescriptions    Medication Sig Dispense Auth. Provider   metroNIDAZOLE (FLAGYL) 500 MG tablet Take 1 tablet (500 mg total) by mouth 2 (two) times daily for 7 days. 14 tablet Rudolpho Sevin, NP   fluconazole (DIFLUCAN) 150 MG tablet Take 1 tablet (150 mg total) by mouth  daily. Okay to repeat in 3 days if symptoms persist 2 tablet Rudolpho Sevin, NP     PDMP not reviewed this encounter.   Rudolpho Sevin, NP 03/14/21 1912

## 2021-03-15 ENCOUNTER — Other Ambulatory Visit: Payer: Self-pay | Admitting: Family Medicine

## 2021-03-15 LAB — CERVICOVAGINAL ANCILLARY ONLY
Bacterial Vaginitis (gardnerella): POSITIVE — AB
Chlamydia: NEGATIVE
Comment: NEGATIVE
Comment: NEGATIVE
Comment: NEGATIVE
Comment: NORMAL
Neisseria Gonorrhea: NEGATIVE
Trichomonas: NEGATIVE

## 2021-03-16 ENCOUNTER — Telehealth (INDEPENDENT_AMBULATORY_CARE_PROVIDER_SITE_OTHER): Payer: Medicaid Other | Admitting: Family Medicine

## 2021-03-16 ENCOUNTER — Other Ambulatory Visit: Payer: Self-pay | Admitting: Family Medicine

## 2021-03-16 ENCOUNTER — Other Ambulatory Visit: Payer: Self-pay

## 2021-03-16 ENCOUNTER — Encounter: Payer: Self-pay | Admitting: Family Medicine

## 2021-03-16 DIAGNOSIS — M5126 Other intervertebral disc displacement, lumbar region: Secondary | ICD-10-CM | POA: Diagnosis not present

## 2021-03-16 MED ORDER — OXYCODONE-ACETAMINOPHEN 5-325 MG PO TABS: 1.0000 | ORAL_TABLET | ORAL | 0 refills | Status: DC | PRN

## 2021-03-16 MED ORDER — CYCLOBENZAPRINE HCL 10 MG PO TABS: 10.0000 mg | ORAL_TABLET | Freq: Two times a day (BID) | ORAL | 0 refills | Status: DC | PRN

## 2021-03-16 NOTE — Progress Notes (Signed)
[facility] Tele[facility]icine Visit  Patient consented to have virtual visit and was identified by name and date of birth. Method of visit: Telephone  Encounter participants: Patient: Miranda Fischer - located at private location  Provider: Wilber Oliphant - located at [facility] Others (if applicable):   Chief Complaint: Follow up herniated disc   HPI:  Pain is improved, especially during the day with movement, but still with twinges of pain here and there. Most pain is at night and in the morning. 2 tabs every 6 hours, typically, but only taking it with pain now. Patient ran out of percocet yesterday. Tingling stopped in her lower extremities. Overall, feeling better. PT scheduled for Monday. She is also doing stretches at home.   Denies lower extremity numbness/tingling/weakness of lower extremities, bladder incontinence/retention, dysuria, bowel incontinence, saddle anesthesia.   ROS: per HPI  Pertinent PMHx: No hx of trauma, surgery   Exam:  LMP 02/18/2021   Respiratory: no distress.   Assessment/Plan:  Herniated lumbar disc without myelopathy Overall, patient is improving. Will refill flexeril and #30 percocet which patient is only taking as needed. I anticipate that she will not need this for much longer. She will need to be seen in the office for the next visit should she continue to have pain for further evaluation.     Time spent during visit with patient: 9 minutes

## 2021-03-16 NOTE — Assessment & Plan Note (Signed)
Overall, patient is improving. Will refill flexeril and #30 percocet which patient is only taking as needed. I anticipate that she will not need this for much longer. She will need to be seen in the office for the next visit should she continue to have pain for further evaluation.

## 2021-03-21 ENCOUNTER — Encounter: Payer: Self-pay | Admitting: Physical Therapy

## 2021-03-21 ENCOUNTER — Other Ambulatory Visit: Payer: Self-pay

## 2021-03-21 ENCOUNTER — Ambulatory Visit: Payer: Medicaid Other | Attending: Family Medicine | Admitting: Physical Therapy

## 2021-03-21 DIAGNOSIS — R252 Cramp and spasm: Secondary | ICD-10-CM | POA: Diagnosis present

## 2021-03-21 DIAGNOSIS — M5441 Lumbago with sciatica, right side: Secondary | ICD-10-CM | POA: Diagnosis present

## 2021-03-21 DIAGNOSIS — R262 Difficulty in walking, not elsewhere classified: Secondary | ICD-10-CM | POA: Insufficient documentation

## 2021-03-21 DIAGNOSIS — M6281 Muscle weakness (generalized): Secondary | ICD-10-CM | POA: Diagnosis present

## 2021-03-21 NOTE — Therapy (Signed)
[place] [place], [place], [address] Phone: [phone]   Fax:  [phone]  Physical Therapy Evaluation  Patient Details  Name: Miranda Fischer MRN: [id number] Date of Birth: [birth year] Referring Provider (PT): Annice Needy Date: 03/21/2021   PT End of Session - 03/21/21 1736    Visit Number 1    Date for PT Re-Evaluation 06/13/21    PT Start Time 1656    PT Stop Time 1729    PT Time Calculation (min) 33 min    Activity Tolerance Patient limited by pain    Behavior During Therapy [facility] for tasks assessed/performed           Past Medical History:  Diagnosis Date  . Back spasm 02/28/2020    History reviewed. No pertinent surgical history.  There were no vitals filed for this visit.    Subjective Assessment - 03/21/21 1659    Subjective Pt reports that she has been having LBP with radiating pain for the past month. Pt states that she has the most pain in the mornings/night. Pt states that she was having radiating pain in RLE originally; no longer having radiating pain just mostly pain focused in R LB. Went to ED and PCP several times for this issue; states pain medicine and muscle relaxers have helped some but do not completely take pain away. MRI to [facility] showed mild herniated disc. 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 Must stand or stretch in chair to remain comfortable. Bending forward and standing relieve pain.    Limitations Lifting;Standing    How long can you sit comfortably? 5 seconds    How long can you stand comfortably? standing is more comfortable    Diagnostic tests MRI lumbar spine    Patient Stated Goals get rid of LBP    Currently in Pain? Yes    Pain Score 7     Pain Location Back    Pain Orientation Lower;Right    Pain Descriptors / Indicators Sharp;Dull    Pain Type Acute pain    Pain Radiating Towards RLE    Pain Onset 1 to 4 weeks  ago    Pain Frequency Constant    Aggravating Factors  sitting, lifting, carrying, twisting    Pain Relieving Factors bending, standing, pain medication              [facility] PT Assessment - 03/21/21 0001      Assessment   Medical Diagnosis LBP    Referring Provider (PT) Nori Riis    Onset Date/Surgical Date --   4 weeks ago   Next MD Visit --   will call back as needed   Prior Therapy none      Precautions   Precautions None      Restrictions   Weight Bearing Restrictions No      Balance Screen   Has the patient fallen in the past 6 months Yes    How many times? 1   couldn't get off floor   Has the patient had a decrease in activity level because of a fear of falling?  No    Is the patient reluctant to leave their home because of a fear of falling?  No      Home Environment   Additional Comments a few stairs to enter; reports no trouble with stairs      Prior Function   Level of  Independence Independent    Vocation Full time employment    Physicist, medical; office job, sits all day    Leisure yoga, dancing, basketball, playing with son      Functional Tests   Functional tests Sit to Stand      Sit to Stand   Comments difficulty with eccentric control, UEs on thighs, leaning to LLE      Posture/Postural Control   Posture/Postural Control Postural limitations    Postural Limitations Rounded Shoulders;Flexed trunk      ROM / Strength   AROM / PROM / Strength AROM;Strength      AROM   AROM Assessment Site Lumbar    Lumbar Flexion WFL, reports relief of pain    Lumbar Extension unable to attempt secondary to pain    Lumbar - Right Side Bend 50% limited    Lumbar - Left Side Bend 50% limited    Lumbar - Right Rotation 75% limited + pain    Lumbar - Left Rotation 75% limited + pain      Strength   Overall Strength Comments 4-/5 RLE, 4+/5 LLE; limited d/t pain      Flexibility   Soft Tissue Assessment /Muscle Length yes    Hamstrings WFL,  hyperextension of knees with hamstring stretching    ITB WFL    Piriformis WFL      Palpation   Palpation comment tender to palpation B lumbar paraspinals R>L      Transfers   Five time sit to stand comments  unable to attempt      Ambulation/Gait   Gait Comments antalgic gait with widened BOS, decreased arm swing, decreased trunk rotation                      Objective measurements completed on examination: See above findings.                 PT Short Term Goals - 03/21/21 1741      PT SHORT TERM GOAL #1   Title Pt will be I with initial HEP    Time 2    Period Weeks    Status New    Target Date 04/04/21             PT Long Term Goals - 03/21/21 1741      PT LONG TERM GOAL #1   Title Pt will be I with advanced HEP    Time 6    Period Weeks    Status New    Target Date 05/02/21      PT LONG TERM GOAL #2   Title Pt will report 50% reduction in LBP    Time 6    Period Weeks    Status New    Target Date 05/02/21      PT LONG TERM GOAL #3   Title Pt will demo 5x STS <15 seconds with no UE assist    Baseline unable to attempt    Time 6    Period Weeks    Status New    Target Date 05/02/21      PT LONG TERM GOAL #4   Title Pt will demo lumbar AROM <25% limited with no increase in pain    Time 6    Period Weeks    Status New    Target Date 05/02/21      PT LONG TERM GOAL #5   Title Pt will report able to sit >30 minutes  with no increase in LBP    Time 6    Period Weeks    Status New    Target Date 05/02/21                  Plan - 03/21/21 1736    Clinical Impression Statement Pt presents to clinic with reports of acute onset R sided LBP beginning ~4 weeks ago, no known MOI. Pt was experiencing radiating RLE pain; now centralized to R LB. Denies N/T. Pt demos antalgic gait into clinic with widened BOS, and decreased reciprocal arm swing/trunk rotation. Pt strength testing limited by pain. Demos significant reduction in  lumbar AROM, unable to attempt lumbar extension d/t pain. Reporting relief of pain with lumbar flexion. Pt would like to return to dance, yoga, and playing with her son. Pt would benefit from skilled PT to address the above impairments and return to PLOF.    Examination-Activity Limitations Sit;Lift    Examination-Participation Restrictions Community Activity;Interpersonal Relationship    Stability/Clinical Decision Making Stable/Uncomplicated    Clinical Decision Making Low    Rehab Potential Good    PT Frequency 2x / week    PT Duration 6 weeks    PT Treatment/Interventions ADLs/Self Care Home Management;Electrical Stimulation;Iontophoresis 4mg /ml Dexamethasone;Moist Heat;Neuromuscular re-education;Therapeutic exercise;Therapeutic activities;Patient/family education;Manual techniques;Dry needling;Passive range of motion    PT Next Visit Plan review/progress HEP, lumbar flexibility, core/lumbar stab, gentle progression of TE to tolerance, manual/modalities as indicated    PT Home Exercise Plan see pt instructions    Consulted and Agree with Plan of Care Patient           Patient will benefit from skilled therapeutic intervention in order to improve the following deficits and impairments:  Abnormal gait,Decreased range of motion,Difficulty walking,Decreased endurance,Increased muscle spasms,Decreased activity tolerance,Pain,Hypomobility,Decreased strength,Postural dysfunction  Visit Diagnosis: Acute right-sided low back pain with right-sided sciatica  Muscle weakness (generalized)  Difficulty in walking, not elsewhere classified  Cramp and spasm     Problem List Patient Active Problem List   Diagnosis Date Noted  . Herniated lumbar disc without myelopathy 03/03/2021  . Acute right-sided low back pain with right-sided sciatica 03/01/2021  . Vaginal discharge 01/15/2020  . Screening examination for STD (sexually transmitted disease) 12/31/2019  . Screening for cervical cancer  12/31/2019  . At risk for anemia 12/13/2019  . Mental health problem 12/13/2019  . Abnormal TSH 11/11/2019  . Osteoid osteoma of femur, right 04/06/2017   Amador Cunas, PT, DPT Donald Prose [facility] 03/21/2021, 5:44 PM  [place] [place], [place], [address] Phone: [phone]   Fax:  [phone]  Name: Miranda Fischer MRN: [id number] Date of Birth: October 22, 1996

## 2021-03-21 NOTE — Patient Instructions (Signed)
Access Code: [email or web address] URL: [email or web address] Date: 03/21/2021 Prepared by: Amador Cunas  Exercises Supine Posterior Pelvic Tilt - 1 x daily - 7 x weekly - 3 sets - 10 reps - 3 sec hold Supine Bridge - 1 x daily - 7 x weekly - 3 sets - 10 reps - 3 sec hold Supine Lower Trunk Rotation - 1 x daily - 7 x weekly - 3 sets - 10 reps - 5 sec hold Supine March - 1 x daily - 7 x weekly - 3 sets - 10 reps Child's Pose with Sidebending - 1 x daily - 7 x weekly - 3 sets - 2 reps

## 2021-04-05 ENCOUNTER — Ambulatory Visit: Payer: Medicaid Other

## 2021-04-06 ENCOUNTER — Ambulatory Visit: Payer: Medicaid Other | Admitting: Physical Therapy

## 2021-04-11 ENCOUNTER — Ambulatory Visit: Payer: Medicaid Other | Admitting: Physical Therapy

## 2021-04-13 ENCOUNTER — Ambulatory Visit: Payer: Medicaid Other | Admitting: Physical Therapy

## 2021-04-18 ENCOUNTER — Ambulatory Visit: Payer: Medicaid Other | Admitting: Physical Therapy

## 2021-04-20 ENCOUNTER — Ambulatory Visit: Payer: Medicaid Other

## 2021-04-21 ENCOUNTER — Other Ambulatory Visit: Payer: Self-pay

## 2021-04-21 ENCOUNTER — Ambulatory Visit (INDEPENDENT_AMBULATORY_CARE_PROVIDER_SITE_OTHER): Payer: Medicaid Other | Admitting: Family Medicine

## 2021-04-21 ENCOUNTER — Encounter: Payer: Self-pay | Admitting: Family Medicine

## 2021-04-21 VITALS — BP 104/60 | Ht 66.0 in | Wt 204.4 lb

## 2021-04-21 DIAGNOSIS — N760 Acute vaginitis: Secondary | ICD-10-CM

## 2021-04-21 DIAGNOSIS — N898 Other specified noninflammatory disorders of vagina: Secondary | ICD-10-CM

## 2021-04-21 DIAGNOSIS — B9689 Other specified bacterial agents as the cause of diseases classified elsewhere: Secondary | ICD-10-CM

## 2021-04-21 LAB — POCT WET PREP (WET MOUNT)
Clue Cells Wet Prep Whiff POC: POSITIVE
Trichomonas Wet Prep HPF POC: ABSENT

## 2021-04-21 MED ORDER — METRONIDAZOLE 500 MG PO TABS: 500.0000 mg | ORAL_TABLET | Freq: Two times a day (BID) | ORAL | 0 refills | Status: DC

## 2021-04-21 NOTE — Assessment & Plan Note (Addendum)
History of recurrent BV, previously seeing Ob/gyn with treatments involving boric acid suppositories in and Flagyl in the past. Will prescribe Flagyl with consideration to try boric acid suppositories again if not improving or resolving. - metronidazole 500mg  BID x7 days

## 2021-04-21 NOTE — Progress Notes (Signed)
    SUBJECTIVE:   CHIEF COMPLAINT / HPI:   Bacterial vaginosis Patient with history of recurrent BV, last 1 a positive in May.  Patient reports a few days of change in her vaginal discharge with an increase in odor that is "foul".  Patient denies having any itching, clumping of the discharge, burning or difficulty with urination.  PERTINENT  PMH / PSH: Recurrent BV  OBJECTIVE:   BP 104/60   Ht 5\' 6"  (1.676 m)   Wt 204 lb 6 oz (92.7 kg)   LMP 04/08/2021   BMI 32.99 kg/m   General: NAD, well-appearing, well-nourished Respiratory: No respiratory distress, breathing comfortably, able to speak in full sentences Skin: warm and dry, no rashes noted on exposed skin Psych: Appropriate affect and mood Pelvic: Normally developed genitalia with no external lesions or eruptions.  Vagina and cervix showed no erythema, lesions, inflammation.  Minimal thin white discharge present.  Chaperoned by Salvatore Marvel, CMA  ASSESSMENT/PLAN:   Bacterial vaginosis History of recurrent BV, previously seeing Ob/gyn with treatments involving boric acid suppositories in and Flagyl in the past. Will prescribe Flagyl with consideration to try boric acid suppositories again if not improving or resolving. - metronidazole 500mg  BID x7 days    Rise Patience, DO [place]

## 2021-04-21 NOTE — Patient Instructions (Signed)
I will send you a Mychart message or call you with results if necessary. If a prescription is needed I will send that into your pharmacy for you. Please return if it worsens or does not resolve.

## 2021-04-25 ENCOUNTER — Ambulatory Visit: Payer: Medicaid Other | Admitting: Physical Therapy

## 2021-04-27 ENCOUNTER — Ambulatory Visit: Payer: Medicaid Other | Admitting: Physical Therapy

## 2021-05-06 ENCOUNTER — Encounter: Payer: Medicaid Other | Admitting: Family Medicine

## 2021-05-06 NOTE — Progress Notes (Deleted)
    SUBJECTIVE:   Chief compliant/HPI: annual examination  Shell Sanya Kobrin is a 25 y.o. who presents today for an annual exam.   Review of systems form notable for ***.   Updated history tabs and problem list ***.   OBJECTIVE:   LMP 04/08/2021   General: Alert, no acute distress Cardio: Normal S1 and S2, RRR, no r/m/g Pulm: CTAB, normal work of breathing Abdomen: Bowel sounds normal. Abdomen soft and non-tender.  Extremities: No peripheral edema.  Neuro: Cranial nerves grossly intact   ASSESSMENT/PLAN:   No problem-specific Assessment & Plan notes found for this encounter.    Annual Examination  See AVS for age appropriate recommendations.   PHQ score ***, reviewed and discussed. Blood pressure reviewed and at goal ***.  Asked about intimate partner violence and patient reports ***.  The patient currently uses *** for contraception. Folate recommended as appropriate, minimum of 400 mcg per day.  Advanced directives ***   Considered the following items based upon USPSTF recommendations: HIV testing: {discussed/ordered:14545} Hepatitis C: {discussed/ordered:14545} Hepatitis B: {discussed/ordered:14545} Syphilis if at high risk: {discussed/ordered:14545} GC/CT {GC/CT screening :23818} Lipid panel (nonfasting or fasting) discussed based upon AHA recommendations and {ordered not order:23822}.  Consider repeat every 4-6 years.  Reviewed risk factors for latent tuberculosis and {not indicated/requested/declined:14582}  Discussed family history, BRCA testing {not indicated/requested/declined:14582}. Tool used to risk stratify was Pedigree Assessment tool ***  Cervical cancer screening: {PAPTYPE:23819} Immunizations ***   Follow up in 1  *** year or sooner if indicated.    Lattie Haw, MD [place]

## 2021-05-13 ENCOUNTER — Encounter: Payer: Self-pay | Admitting: Student

## 2021-05-13 ENCOUNTER — Other Ambulatory Visit: Payer: Self-pay

## 2021-05-13 ENCOUNTER — Ambulatory Visit (INDEPENDENT_AMBULATORY_CARE_PROVIDER_SITE_OTHER): Payer: Medicaid Other | Admitting: Student

## 2021-05-13 VITALS — BP 90/60 | HR 84 | Ht 66.0 in | Wt 197.5 lb

## 2021-05-13 DIAGNOSIS — Z0000 Encounter for general adult medical examination without abnormal findings: Secondary | ICD-10-CM

## 2021-05-13 DIAGNOSIS — Z6831 Body mass index (BMI) 31.0-31.9, adult: Secondary | ICD-10-CM | POA: Diagnosis not present

## 2021-05-13 DIAGNOSIS — E669 Obesity, unspecified: Secondary | ICD-10-CM

## 2021-05-13 NOTE — Patient Instructions (Signed)
Today at your annual preventive visit we talked about the following measures:   I recommend 150 minutes of exercise per week-try 30 minutes 5 days per week We discussed reducing sugary beverages (like soda and juice) and increasing leafy greens and whole fruits.  We discussed avoiding tobacco and alcohol.  I recommend avoiding illicit substances.  Your blood pressure is 90/60 doing well  Continue taking your multivitamin with iron, probiotic for women, and initiate folate 400mg  if you plan on becoming pregnant Continue with you PT exercises and please contact us if there is anything else you would need   Gerrit Heck, MD   Health Maintenance, Female Adopting a healthy lifestyle and getting preventive care are important in promoting health and wellness. Ask your health care provider about: The right schedule for you to have regular tests and exams. Things you can do on your own to prevent diseases and keep yourself healthy. What should I know about diet, weight, and exercise? Eat a healthy diet  Eat a diet that includes plenty of vegetables, fruits, low-fat dairy products, and lean protein. Do not eat a lot of foods that are high in solid fats, added sugars, or sodium.  Maintain a healthy weight Body mass index (BMI) is used to identify weight problems. It estimates body fat based on height and weight. Your health care provider can help determineyour BMI and help you achieve or maintain a healthy weight. Get regular exercise Get regular exercise. This is one of the most important things you can do for your health. Most adults should: Exercise for at least 150 minutes each week. The exercise should increase your heart rate and make you sweat (moderate-intensity exercise). Do strengthening exercises at least twice a week. This is in addition to the moderate-intensity exercise. Spend less time sitting. Even light physical activity can be beneficial. Watch cholesterol and blood  lipids Have your blood tested for lipids and cholesterol at 27 years of age, then havethis test every 5 years. Have your cholesterol levels checked more often if: Your lipid or cholesterol levels are high. You are older than 27 years of age. You are at high risk for heart disease. What should I know about cancer screening? Depending on your health history and family history, you may need to have cancer screening at various ages. This may include screening for: Breast cancer. Cervical cancer. Colorectal cancer. Skin cancer. Lung cancer. What should I know about heart disease, diabetes, and high blood pressure? Blood pressure and heart disease High blood pressure causes heart disease and increases the risk of stroke. This is more likely to develop in people who have high blood pressure readings, are of African descent, or are overweight. Have your blood pressure checked: Every 3-5 years if you are 38-55 years of age. Every year if you are 74 years old or older. Diabetes Have regular diabetes screenings. This checks your fasting blood sugar level. Have the screening done: Once every three years after age 67 if you are at a normal weight and have a low risk for diabetes. More often and at a younger age if you are overweight or have a high risk for diabetes. What should I know about preventing infection? Hepatitis B If you have a higher risk for hepatitis B, you should be screened for this virus. Talk with your health care provider to find out if you are at risk forhepatitis B infection. Hepatitis C Testing is recommended for: Everyone born from [birth year] through 1967. Anyone with known risk  factors for hepatitis C. Sexually transmitted infections (STIs) Get screened for STIs, including gonorrhea and chlamydia, if: You are sexually active and are younger than 27 years of age. You are older than 27 years of age and your health care provider tells you that you are at risk for this type of  infection. Your sexual activity has changed since you were last screened, and you are at increased risk for chlamydia or gonorrhea. Ask your health care provider if you are at risk. Ask your health care provider about whether you are at high risk for HIV. Your health care provider may recommend a prescription medicine to help prevent HIV infection. If you choose to take medicine to prevent HIV, you should first get tested for HIV. You should then be tested every 3 months for as long as you are taking the medicine. Pregnancy If you are about to stop having your period (premenopausal) and you may become pregnant, seek counseling before you get pregnant. Take 400 to 800 micrograms (mcg) of folic acid every day if you become pregnant. Ask for birth control (contraception) if you want to prevent pregnancy. Osteoporosis and menopause Osteoporosis is a disease in which the bones lose minerals and strength with aging. This can result in bone fractures. If you are 1 years old or older, or if you are at risk for osteoporosis and fractures, ask your health care provider if you should: Be screened for bone loss. Take a calcium or vitamin D supplement to lower your risk of fractures. Be given hormone replacement therapy (HRT) to treat symptoms of menopause. Follow these instructions at home: Lifestyle Do not use any products that contain nicotine or tobacco, such as cigarettes, e-cigarettes, and chewing tobacco. If you need help quitting, ask your health care provider. Do not use street drugs. Do not share needles. Ask your health care provider for help if you need support or information about quitting drugs. Alcohol use Do not drink alcohol if: Your health care provider tells you not to drink. You are pregnant, may be pregnant, or are planning to become pregnant. If you drink alcohol: Limit how much you use to 0-1 drink a day. Limit intake if you are breastfeeding. Be aware of how much alcohol is in  your drink. In the [place] one drink equals one 12 oz bottle of beer (355 mL), one 5 oz glass of wine (148 mL), or one 1 oz glass of hard liquor (44 mL). General instructions Schedule regular health, dental, and eye exams. Stay current with your vaccines. Tell your health care provider if: You often feel depressed. You have ever been abused or do not feel safe at home. Summary Adopting a healthy lifestyle and getting preventive care are important in promoting health and wellness. Follow your health care provider's instructions about healthy diet, exercising, and getting tested or screened for diseases. Follow your health care provider's instructions on monitoring your cholesterol and blood pressure. This information is not intended to replace advice given to you by your health care provider. Make sure you discuss any questions you have with your healthcare provider. Document Revised: 10/09/2018 Document Reviewed: 10/09/2018 Elsevier Patient Education  2022 [employer].

## 2021-05-13 NOTE — Progress Notes (Addendum)
SUBJECTIVE:   Chief compliant/HPI: annual examination  Miranda Fischer is a 25 y.o. who presents today for an annual exam.   Social: Patient attests to smoking 2 "black and milds" a day and plans on quitting due to her new place in [place] not allowing smoking. Would like to quit without medications, gums, or patches. Plans on moving to [address] in 2 weeks to be with family again. Patient is slightly stressed with moving process but otherwise doing well. Patient attests to no IVDU but recreationally uses marijuana. She socially drinks alcohol and drinks 1 drink a week if at all. She does not have sex with males. She has had no new partners due to recurrent BV infections but is up to date on STI screenings.   Elevated BMI: She has not been able to exercise due to her lower back pain but is now starting to do more due to improvement in this. Her drinks in her diet includes juices currently. She is excited to start doing more exercise now that her back pain has improved.  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 She has also gotten an injection in her back at the orthopedics clinic on 6/30 and has felt much better since.  Med list reviewed. Patient also takes one a day vitamin with iron and women's probiotic  Review of systems negative   Updated history tabs and problem list  OBJECTIVE:   BP 90/60   Pulse 84   Ht _0  (1.676 m)   Wt 197 lb 8 oz (89.6 kg)   LMP 05/09/2021   SpO2 98%   BMI 31.88 kg/m   Physical Exam Vitals reviewed.  HENT:     Head: Normocephalic and atraumatic.  Cardiovascular:     Rate and Rhythm: Normal rate and regular rhythm.     Pulses: Normal pulses.     Heart sounds: No murmur heard.   No friction rub. No gallop.  Pulmonary:     Effort: Pulmonary effort is normal. No  respiratory distress.     Breath sounds: Normal breath sounds. No wheezing, rhonchi or rales.  Abdominal:     General: Bowel sounds are normal.     Palpations: Abdomen is soft.     Tenderness: There is no abdominal tenderness. There is no guarding.  Musculoskeletal:     Right lower leg: No edema.     Left lower leg: No edema.  Skin:    General: Skin is warm and dry.  Neurological:     General: No focal deficit present.     Mental Status: She is alert.  Psychiatric:        Mood and Affect: Mood normal.        Behavior: Behavior normal.     ASSESSMENT/PLAN:   Obesity (BMI 30-39.9) Discussed BMI with patient. Patient curious to know healthy weight and showed cdc website for healthy BMIs. Encourage patient to take small steps towards goals. Patient agreed to eating more vegetables with meals and trying to walk more as she cannot do her previous workout due to back pain. Encouraged her to make small goals that she can be consistent with and elevate that with time. Patient appreciative. Included nutrition handout and well woman handout with AVS.    Annual Examination  See AVS for age appropriate recommendations.   PHQ score 4, reviewed  and discussed. Blood pressure reviewed and at goal 90/60 today.  The patient does not use contraception as she does not have sex with males. Folate recommended as appropriate, minimum of 400 mcg per day.  Discussed smoking cessation and encouraged her to contact us if she would like some strategies to help. In contemplation/preparation stage.  Considered the following items based upon USPSTF recommendations: HIV testing: negative 07/28/2020 Hepatitis C: <0.1 07/28/2020 Hepatitis B: not discussed Syphilis if at high risk: non-reactive 07/28/2020 GC/CT not at high risk and not ordered. Negative 03/14/2021  Discussed family history patient was from foster care and doesn't know history, BRCA testing  undecided .   Cervical cancer screening: normal prior Pap  reviewed, repeat due in December 31 2022  Follow up in 1 year or sooner if indicated.    Gerrit Heck, MD [place]

## 2021-05-13 NOTE — Assessment & Plan Note (Signed)
Discussed BMI with patient. Patient curious to know healthy weight and showed cdc website for healthy BMIs. Encourage patient to take small steps towards goals. Patient agreed to eating more vegetables with meals and trying to walk more as she cannot do her previous workout due to back pain. Encouraged her to make small goals that she can be consistent with and elevate that with time. Patient appreciative. Included nutrition handout and well woman handout with AVS.

## 2021-06-27 ENCOUNTER — Other Ambulatory Visit (HOSPITAL_COMMUNITY)
Admission: RE | Admit: 2021-06-27 | Discharge: 2021-06-27 | Disposition: A | Discharge status: Home / Self Care | Payer: Medicaid Other | Source: Ambulatory Visit | Attending: Family Medicine | Admitting: Family Medicine

## 2021-06-27 ENCOUNTER — Emergency Department
Admission: RE | Admit: 2021-06-27 | Discharge: 2021-06-27 | Disposition: A | Discharge status: Home / Self Care | Payer: Medicaid Other | Source: Ambulatory Visit | Attending: Family Medicine | Admitting: Family Medicine

## 2021-06-27 ENCOUNTER — Other Ambulatory Visit: Payer: Self-pay

## 2021-06-27 VITALS — BP 124/87 | HR 92 | Temp 98.7°F | Resp 14 | Ht 66.0 in | Wt 200.0 lb

## 2021-06-27 DIAGNOSIS — B9689 Other specified bacterial agents as the cause of diseases classified elsewhere: Secondary | ICD-10-CM | POA: Diagnosis not present

## 2021-06-27 DIAGNOSIS — N898 Other specified noninflammatory disorders of vagina: Secondary | ICD-10-CM | POA: Diagnosis present

## 2021-06-27 DIAGNOSIS — N760 Acute vaginitis: Secondary | ICD-10-CM | POA: Diagnosis not present

## 2021-06-27 MED ORDER — FLUCONAZOLE 150 MG PO TABS: 150.0000 mg | ORAL_TABLET | Freq: Every day | ORAL | 0 refills | Status: DC

## 2021-06-27 MED ORDER — METRONIDAZOLE 500 MG PO TABS: 500.0000 mg | ORAL_TABLET | Freq: Two times a day (BID) | ORAL | 0 refills | Status: DC

## 2021-06-27 NOTE — ED Triage Notes (Signed)
Pt seen in UC w/ c/o vaginal odor and discharge x2 days

## 2021-06-27 NOTE — ED Provider Notes (Signed)
[name]    CSN: [id number] Arrival date & time: 06/27/21  1734      History   Chief Complaint Chief Complaint  Patient presents with   Vaginal Discharge    HPI Miranda Fischer is a 25 y.o. female.   HPI Patient has recurring BV infections.  She states that she has noticed that she has an odor present for the last several days.  No discharge.  No itching.  No abdominal pain or fever.  She states that she does not need STD testing.  She would like testing and treatment for BV and Candida only Past Medical History:  Diagnosis Date   Back spasm 02/28/2020    Patient Active Problem List   Diagnosis Date Noted   Obesity (BMI 30-39.9) 05/13/2021   Herniated lumbar disc without myelopathy 03/03/2021   Acute right-sided low back pain with right-sided sciatica 03/01/2021   Vaginal discharge 01/15/2020   Screening examination for STD (sexually transmitted disease) 12/31/2019   Screening for cervical cancer 12/31/2019   At risk for anemia 12/13/2019   Mental health problem 12/13/2019   Bacterial vaginosis 11/11/2019   Abnormal TSH 11/11/2019   Osteoid osteoma of femur, 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 No pertinent surgical history.  OB History   No obstetric history on file.      Home Medications    Prior to Admission medications   Medication Sig Start Date End Date Taking? Authorizing Provider  fluconazole (DIFLUCAN) 150 MG tablet Take 1 tablet (150 mg total) by mouth daily. Repeat in 1 week if needed 06/27/21  Yes Raylene Everts, MD  metroNIDAZOLE (FLAGYL) 500 MG tablet Take 1 tablet (500 mg total) by mouth 2 (two) times daily. 06/27/21  Yes Raylene Everts, MD    Family History Family History  Family history unknown: Yes    Social History Social History   Tobacco Use   Smoking status: Every Day    Types: Cigarettes   Smokeless tobacco: Never   Tobacco comments:    Black and milds. Patient in contemplative/planning stage-  wants to quit "cold [place]" without medication, patches, or gum  Vaping Use   Vaping Use: Never used  Substance Use Topics   Alcohol use: Yes    Comment: occasionally   Drug use: Yes    Types: Marijuana    Comment: socially     Allergies   Penicillins and Shellfish allergy   Review of Systems Review of Systems See HPI  Physical Exam Triage Vital Signs ED Triage Vitals  Enc Vitals Group     BP 06/27/21 1742 124/87     Pulse Rate 06/27/21 1742 92     Resp 06/27/21 1742 14     Temp 06/27/21 1742 98.7 F (37.1 C)     Temp Source 06/27/21 1742 Oral     SpO2 06/27/21 1742 100 %     Weight 06/27/21 1743 200 lb (90.7 kg)     Height 06/27/21 1743 '5\' 6"'$  (1.676 m)     Head Circumference --      Peak Flow --      Pain Score 06/27/21 1743 0     Pain Loc --      Pain Edu? --      Excl. in [place]? --    No data found.  Updated Vital Signs BP 124/87 (BP Location: Left Arm)   Pulse 92   Temp 98.7 F (37.1 C) (Oral)   Resp 14  Ht '5\' 6"'$  (1.676 m)   Wt 90.7 kg   SpO2 100%   BMI 32.28 kg/m       Physical Exam Constitutional:      General: She is not in acute distress.    Appearance: Normal appearance. She is well-developed.  HENT:     Head: Normocephalic and atraumatic.  Eyes:     Conjunctiva/sclera: Conjunctivae normal.     Pupils: Pupils are equal, round, and reactive to light.  Cardiovascular:     Rate and Rhythm: Normal rate.  Pulmonary:     Effort: Pulmonary effort is normal. No respiratory distress.  Abdominal:     General: There is no distension.     Palpations: Abdomen is soft.  Genitourinary:    Comments: Technique for self swab was discussed Musculoskeletal:        General: Normal range of motion.     Cervical back: Normal range of motion.  Skin:    General: Skin is warm and dry.  Neurological:     Mental Status: She is alert.  Psychiatric:        Mood and Affect: Mood normal.        Behavior: Behavior normal.     UC Treatments / Results   Labs (all labs ordered are listed, but only abnormal results are displayed) Labs Reviewed  CERVICOVAGINAL ANCILLARY ONLY    EKG   Radiology No results found.  Procedures Procedures (including critical care time)  Medications Ordered in UC Medications - No data to display  Initial Impression / Assessment and Plan / UC Course  I have reviewed the triage vital signs and the nursing notes.  Pertinent labs & imaging results that were available during my care of the patient were reviewed by me and considered in my medical decision making (see chart for details).     Discussed prevention of recurring BV.  Probiotics.  Feminine hygiene products.  Mild soaps.  Return as needed Final Clinical Impressions(s) / UC Diagnoses   Final diagnoses:  BV (bacterial vaginosis)  Acute vaginitis     Discharge Instructions      Take metronidazole 2 times a day for 7 days Take Diflucan as directed, 1 today in the morning and in 7 days  Check MyChart for your test results You will be called if any of your results come back positive   ED Prescriptions     Medication Sig Dispense Auth. Provider   metroNIDAZOLE (FLAGYL) 500 MG tablet Take 1 tablet (500 mg total) by mouth 2 (two) times daily. 14 tablet Raylene Everts, MD   fluconazole (DIFLUCAN) 150 MG tablet Take 1 tablet (150 mg total) by mouth daily. Repeat in 1 week if needed 2 tablet Raylene Everts, MD      PDMP not reviewed this encounter.   Raylene Everts, MD 06/27/21 [phone]

## 2021-06-27 NOTE — Discharge Instructions (Addendum)
Take metronidazole 2 times a day for 7 days Take Diflucan as directed, 1 today in the morning and in 7 days  Check MyChart for your test results You will be called if any of your results come back positive

## 2021-06-29 LAB — CERVICOVAGINAL ANCILLARY ONLY
Bacterial Vaginitis (gardnerella): POSITIVE — AB
Candida Glabrata: NEGATIVE
Candida Vaginitis: NEGATIVE
Chlamydia: NEGATIVE
Comment: NEGATIVE
Comment: NEGATIVE
Comment: NEGATIVE
Comment: NEGATIVE
Comment: NEGATIVE
Comment: NORMAL
Neisseria Gonorrhea: NEGATIVE
Trichomonas: NEGATIVE

## 2021-07-11 ENCOUNTER — Other Ambulatory Visit: Payer: Self-pay | Admitting: Family Medicine

## 2021-07-11 ENCOUNTER — Telehealth: Payer: Self-pay

## 2021-07-11 MED ORDER — CYCLOBENZAPRINE HCL 10 MG PO TABS: 10.0000 mg | ORAL_TABLET | Freq: Three times a day (TID) | ORAL | 0 refills | Status: DC | PRN

## 2021-07-11 NOTE — Telephone Encounter (Signed)
Patient calls nurse line requesting rx refill on cyclobenzaprine. Patient has been using medication for back spasms.   Please advise.   Talbot Grumbling, RN

## 2021-07-11 NOTE — Telephone Encounter (Signed)
Sent in meds to the pharmacy. If pt requires further refill after 30 days then she should come in for a follow up of her back pain. Thank you.

## 2021-07-12 ENCOUNTER — Ambulatory Visit
Admission: RE | Admit: 2021-07-12 | Discharge: 2021-07-12 | Disposition: A | Discharge status: Home / Self Care | Payer: Medicaid Other | Source: Ambulatory Visit | Attending: Emergency Medicine | Admitting: Emergency Medicine

## 2021-07-12 ENCOUNTER — Other Ambulatory Visit: Payer: Self-pay

## 2021-07-12 VITALS — BP 110/72 | HR 57 | Temp 98.2°F | Resp 18

## 2021-07-12 DIAGNOSIS — B373 Candidiasis of vulva and vagina: Secondary | ICD-10-CM | POA: Insufficient documentation

## 2021-07-12 DIAGNOSIS — B3731 Acute candidiasis of vulva and vagina: Secondary | ICD-10-CM

## 2021-07-12 MED ORDER — FLUCONAZOLE 150 MG PO TABS
150.0000 mg | ORAL_TABLET | Freq: Every day | ORAL | 0 refills | Status: DC
Start: 2021-07-12 — End: 2021-07-14

## 2021-07-12 NOTE — ED Provider Notes (Signed)
[facility]    CSN: [id number] Arrival date & time: 07/12/21  1139      History   Chief Complaint Chief Complaint  Patient presents with   Vaginal Discharge    HPI Miranda Fischer is a 25 y.o. female.  She complains of thick white, clumpy vaginal discharge that she thinks is yeast after finishing her period a week ago.  Treated recently for BV and yeast and completed those treatments.  Patient reports recurrent BV but does not feel like she has BV at this time.  Denies nausea, vomiting, abdominal pain, genital lesions, exposure to STIs.  Reviewed risk factors for BV and prior treatment for BV.  Patient does not see a gynecologist at this time because she is planning on moving to [place].    Vaginal Discharge  Past Medical History:  Diagnosis Date   Back spasm 02/28/2020    Patient Active Problem List   Diagnosis Date Noted   Obesity (BMI 30-39.9) 05/13/2021   Herniated lumbar disc without myelopathy 03/03/2021   Acute right-sided low back pain with right-sided sciatica 03/01/2021   Vaginal discharge 01/15/2020   Screening examination for STD (sexually transmitted disease) 12/31/2019   Screening for cervical cancer 12/31/2019   At risk for anemia 12/13/2019   Mental health problem 12/13/2019   Bacterial vaginosis 11/11/2019   Abnormal TSH 11/11/2019   Osteoid osteoma of femur, 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 No pertinent surgical history.  OB History   No obstetric history on file.      Home Medications    Prior to Admission medications   Medication Sig Start Date End Date Taking? Authorizing Provider  fluconazole (DIFLUCAN) 150 MG tablet Take 1 tablet (150 mg total) by mouth daily. 07/12/21  Yes Carvel Getting, NP  cyclobenzaprine (FLEXERIL) 10 MG tablet Take 1 tablet (10 mg total) by mouth 3 (three) times daily as needed for muscle spasms. 07/11/21   Lattie Haw, MD    Family History Family History  Family history unknown: Yes     Social History Social History   Tobacco Use   Smoking status: Every Day    Types: Cigarettes   Smokeless tobacco: Never   Tobacco comments:    Black and milds. Patient in contemplative/planning stage- wants to quit "cold [place]" without medication, patches, or gum  Vaping Use   Vaping Use: Never used  Substance Use Topics   Alcohol use: Yes    Comment: occasionally   Drug use: Yes    Types: Marijuana    Comment: socially     Allergies   Penicillins and Shellfish allergy   Review of Systems Review of Systems  Genitourinary:  Positive for vaginal discharge.    Physical Exam Triage Vital Signs ED Triage Vitals  Enc Vitals Group     BP 07/12/21 1146 110/72     Pulse Rate 07/12/21 1146 (!) 57     Resp 07/12/21 1146 18     Temp 07/12/21 1147 98.2 F (36.8 C)     Temp Source 07/12/21 1146 Oral     SpO2 07/12/21 1146 98 %     Weight --      Height --      Head Circumference --      Peak Flow --      Pain Score 07/12/21 1149 0     Pain Loc --      Pain Edu? --      Excl. in [place]? --  No data found.  Updated Vital Signs BP 110/72   Pulse (!) 57   Temp 98.2 F (36.8 C)   Resp 18   LMP 07/04/2021   SpO2 98%   Visual Acuity Right Eye Distance:   Left Eye Distance:   Bilateral Distance:    Right Eye Near:   Left Eye Near:    Bilateral Near:     Physical Exam Constitutional:      Appearance: Normal appearance.  Pulmonary:     Effort: Pulmonary effort is normal.  Neurological:     Mental Status: She is alert.     Gait: Gait normal.     UC Treatments / Results  Labs (all labs ordered are listed, but only abnormal results are displayed) Labs Reviewed  CERVICOVAGINAL ANCILLARY ONLY    Initial Impression / Assessment and Plan / UC Course  I have reviewed the triage vital signs and the nursing notes.  Pertinent labs & imaging results that were available during my care of the patient were reviewed by me and considered in my medical decision  making (see chart for details).  Based on symptoms, will treat patient for vaginal Candida infection with Diflucan 150 mg x1 per patient request.  Encouraged patient to follow-up with a gynecologist for recurrent BV problems.   Final Clinical Impressions(s) / UC Diagnoses   Final diagnoses:  Candida vaginitis     Discharge Instructions      Follow up with a gynecologist about your recurrent vaginal symptoms when you get settled in [place].    ED Prescriptions     Medication Sig Dispense Auth. Provider   fluconazole (DIFLUCAN) 150 MG tablet Take 1 tablet (150 mg total) by mouth daily. 1 tablet Carvel Getting, NP      PDMP not reviewed this encounter.   Carvel Getting, NP 07/12/21 1252

## 2021-07-12 NOTE — ED Triage Notes (Signed)
Pt is present today with vaginal discharge. Pt states that she was recently treated for yeats and BV and still experiencing sx.

## 2021-07-12 NOTE — Discharge Instructions (Addendum)
Follow up with a gynecologist about your recurrent vaginal symptoms when you get settled in [place].

## 2021-07-13 LAB — CERVICOVAGINAL ANCILLARY ONLY
Bacterial Vaginitis (gardnerella): POSITIVE — AB
Candida Glabrata: NEGATIVE
Candida Vaginitis: NEGATIVE
Chlamydia: NEGATIVE
Comment: NEGATIVE
Comment: NEGATIVE
Comment: NEGATIVE
Comment: NEGATIVE
Comment: NEGATIVE
Comment: NORMAL
Neisseria Gonorrhea: NEGATIVE
Trichomonas: NEGATIVE

## 2021-07-14 ENCOUNTER — Telehealth: Payer: Self-pay

## 2021-07-14 MED ORDER — FLUCONAZOLE 150 MG PO TABS: 150.0000 mg | ORAL_TABLET | Freq: Once | ORAL | 0 refills | Status: AC

## 2021-07-14 MED ORDER — METRONIDAZOLE 500 MG PO TABS: 500.0000 mg | ORAL_TABLET | Freq: Two times a day (BID) | ORAL | 0 refills | Status: DC

## 2021-07-14 NOTE — Telephone Encounter (Signed)
Spoke to patient about positive BV result on 07/12/21. Patient given information on medications being sent out to pharmacy by provider for treatment (all questions answered).

## 2021-08-04 ENCOUNTER — Other Ambulatory Visit: Payer: Self-pay

## 2021-08-04 ENCOUNTER — Ambulatory Visit
Admission: RE | Admit: 2021-08-04 | Discharge: 2021-08-04 | Disposition: A | Discharge status: Home / Self Care | Payer: Medicaid Other | Source: Ambulatory Visit | Attending: Emergency Medicine | Admitting: Emergency Medicine

## 2021-08-04 VITALS — BP 122/84 | HR 76 | Temp 98.5°F | Resp 18 | Wt 203.5 lb

## 2021-08-04 DIAGNOSIS — N898 Other specified noninflammatory disorders of vagina: Secondary | ICD-10-CM | POA: Diagnosis not present

## 2021-08-04 LAB — POCT URINALYSIS DIP (MANUAL ENTRY)
Bilirubin, UA: NEGATIVE
Blood, UA: NEGATIVE
Glucose, UA: NEGATIVE mg/dL
Ketones, POC UA: NEGATIVE mg/dL
Leukocytes, UA: NEGATIVE
Nitrite, UA: NEGATIVE
Protein Ur, POC: 30 mg/dL — AB
Spec Grav, UA: 1.025 (ref 1.010–1.025)
Urobilinogen, UA: 0.2 E.U./dL
pH, UA: 7 (ref 5.0–8.0)

## 2021-08-04 LAB — POCT URINE PREGNANCY: Preg Test, Ur: NEGATIVE

## 2021-08-04 MED ORDER — SOLOSEC 2 G PO PACK: 2.0000 g | PACK | Freq: Once | ORAL | 0 refills | Status: AC

## 2021-08-04 MED ORDER — METRONIDAZOLE 500 MG PO TABS: 500.0000 mg | ORAL_TABLET | Freq: Two times a day (BID) | ORAL | 0 refills | Status: AC

## 2021-08-04 MED ORDER — CLINDAMYCIN PHOSPHATE 2 % VA CREA: 1.0000 | TOPICAL_CREAM | Freq: Every day | VAGINAL | 0 refills | Status: AC

## 2021-08-04 NOTE — Discharge Instructions (Signed)
I see that you were just prescribed metronidazole for the same issue last month.For your recurrent symptoms of bacterial vaginitis, I have prescribed you a medication that is a single dose called Solosec which is specifically formulated to treat difficult to treat cases of bacterial vaginitis.  You can take this medication by sprinkling it over a spoonful of yogurt, pudding or applesauce, please avoid crunching the granules when taking.  Please avoid alcohol for 2 days after taking this medication as it can cause significant headache, nausea and vomiting.  If for reasons beyond our control, your insurance refuses to pay for this medication, I have contacted the company to text you a savings card which can be used with or without your insurance.  If, with his card, this medication is still too expensive, I have provided you with a backup prescription for metronidazole.  Please reach out to your primary care provider to see if they would be willing to reach out to your insurance company to let them know that Solosec is recommended due to your recurrent infections.

## 2021-08-04 NOTE — ED Provider Notes (Signed)
[facility]    CSN: [id number] Arrival date & time: 08/04/21  1418      History   Chief Complaint Chief Complaint  Patient presents with   Vaginal Discharge    HPI Miranda Fischer is a 25 y.o. female.   Pt reports possibly having BV, she reports having malodorous vaginal discharge that is thin and gray.  Patient states that she was treated for BV on July 12, 2021, states she feels like she gets another BV infection every time she has her period.  Patient states she has tried both metronidazole tablets as well as metronidazole gel, states the tablets seem to work a little bit better but never feels like she completely resolves the infection.  Patient states she is also tried boric acid suppositories as well as probiotics, neither of these is effective either.  Patient denies genital lesions, history of sexually transmitted disease.  Patient states she is sexually active at this time, does not use condoms.  Patient denies known exposure to STD, pelvic pain, pelvic pressure, increased frequency of urination, increased urgency to urinate, burning with urination.  The history is provided by the patient.   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 No pertinent surgical history.  OB History   No obstetric history on file.      Home Medications    Prior to Admission medications    Medication Sig Start Date End Date Taking? Authorizing Provider  clindamycin (CLEOCIN) 2 % vaginal cream Place 1 Applicatorful vaginally at bedtime for 7 days. 08/04/21 08/11/21 Yes Lynden Oxford Scales, PA-C  meloxicam (MOBIC) 7.5 MG tablet Take 1 tablet by mouth 2 (two) times daily. 07/12/21  Yes [provider]  metroNIDAZOLE (FLAGYL) 500 MG tablet Take 1 tablet (500 mg total) by mouth 2 (two) times daily. 08/04/21  Yes Lynden Oxford Scales, PA-C  Secnidazole (SOLOSEC) 2 g PACK Take 2 g by mouth once for 1 dose. 08/04/21 08/04/21 Yes Lynden Oxford Scales, PA-C  cyclobenzaprine (FLEXERIL) 10 MG tablet Take 1 tablet (10 mg total) by mouth 3 (three) times daily as needed for muscle spasms. 07/11/21   Lattie Haw, MD    Family History Family History  Family history unknown: Yes    Social History Social History   Tobacco Use   Smoking status: Every Day    Types: Cigarettes   Smokeless tobacco: Never   Tobacco comments:    Black and milds. Patient in contemplative/planning stage- wants to quit "cold [place]" without medication, patches, or gum  Vaping Use   Vaping Use: Never used  Substance Use Topics   Alcohol use: Yes    Comment: occasionally   Drug use: Yes    Types: Marijuana    Comment: socially     Allergies   Penicillins and Shellfish allergy   Review of Systems Review of Systems Pertinent findings noted in history of present illness.    Physical  Exam Triage Vital Signs ED Triage Vitals  Enc Vitals Group     BP 08/04/21 1517 122/84     Pulse Rate 08/04/21 1517 76     Resp 08/04/21 1517 18     Temp 08/04/21 1517 98.5 F (36.9 C)     Temp Source 08/04/21 1517 Oral     SpO2 08/04/21 1517 98 %     Weight 08/04/21 1522 203 lb 8 oz (92.3 kg)     Height --      Head Circumference --      Peak Flow --      Pain Score 08/04/21 1516 0     Pain Loc --      Pain Edu? --      Excl. in [place]? --    No data found.  Updated Vital Signs BP 122/84 (BP  Location: Right Arm)   Pulse 76   Temp 98.5 F (36.9 C) (Oral)   Resp 18   Wt 203 lb 8 oz (92.3 kg)   LMP 07/25/2021   SpO2 98%   BMI 32.85 kg/m   Visual Acuity Right Eye Distance:   Left Eye Distance:   Bilateral Distance:    Right Eye Near:   Left Eye Near:    Bilateral Near:     Physical Exam Vitals and nursing note reviewed.  Constitutional:      Appearance: Normal appearance.  HENT:     Head: Normocephalic and atraumatic.  Cardiovascular:     Rate and Rhythm: Normal rate and regular rhythm.     Pulses: Normal pulses.     Heart sounds: Normal heart sounds.  Pulmonary:     Effort: Pulmonary effort is normal.     Breath sounds: Normal breath sounds.  Abdominal:     General: Abdomen is flat. Bowel sounds are normal.     Palpations: Abdomen is soft.  Musculoskeletal:        General: Normal range of motion.     Cervical back: Normal range of motion and neck supple.  Skin:    General: Skin is warm and dry.  Neurological:     General: No focal deficit present.     Mental Status: She is alert and oriented to person, place, and time. Mental status is at baseline.  Psychiatric:        Mood and Affect: Mood normal.        Behavior: Behavior normal.     UC Treatments / Results  Labs (all labs ordered are listed, but only abnormal results are displayed) Labs Reviewed  POCT URINALYSIS DIP (MANUAL ENTRY) - Abnormal; Notable for the following components:      Result Value   Clarity, UA cloudy (*)    Protein Ur, POC =30 (*)    All other components within normal limits  POCT URINE PREGNANCY  CERVICOVAGINAL ANCILLARY ONLY    EKG   Radiology No results found.  Procedures Procedures (including critical care time)  Medications Ordered in UC Medications - No data to display  Initial Impression / Assessment and Plan / UC Course  I have reviewed the triage vital signs and the nursing notes.  Pertinent labs & imaging results that were available during my care  of the patient were reviewed by me and considered in my medical decision making (see chart for details).     I discussed a new medication with patient called Solosec, which is a single dose antibiotic recommended for the treatment of trichomonas and bacterial  vaginosis.  We also discussed whether or not her insurance might pay for this medication, I suggested they may require prior authorization which we cannot provide for her today.  Patient and I agreed that I will send prescription for Solosec to her pharmacy along with a prescription for metronidazole tablets and clindamycin gel.  Patient has been advised that she will use clindamycin gel with either the Solosec or metronidazole tablets, regardless of which prescription her insurance will pay for.  I provided patient with a pharmaceutical company coupon for [employer].  Patient verbalized understanding and agreement of plan as discussed.  All questions were addressed during visit.  Please see discharge instructions below for further details of plan.  Final Clinical Impressions(s) / UC Diagnoses   Final diagnoses:  Vaginal discharge     Discharge Instructions      I see that you were just prescribed metronidazole for the same issue last month.For your recurrent symptoms of bacterial vaginitis, I have prescribed you a medication that is a single dose called Solosec which is specifically formulated to treat difficult to treat cases of bacterial vaginitis.  You can take this medication by sprinkling it over a spoonful of yogurt, pudding or applesauce, please avoid crunching the granules when taking.  Please avoid alcohol for 2 days after taking this medication as it can cause significant headache, nausea and vomiting.  If for reasons beyond our control, your insurance refuses to pay for this medication, I have contacted the company to text you a savings card which can be used with or without your insurance.  If, with his card, this medication is still  too expensive, I have provided you with a backup prescription for metronidazole.  Please reach out to your primary care provider to see if they would be willing to reach out to your insurance company to let them know that Solosec is recommended due to your recurrent infections.     ED Prescriptions     Medication Sig Dispense Auth. Provider   Secnidazole (SOLOSEC) 2 g PACK Take 2 g by mouth once for 1 dose. 1 each Lynden Oxford Scales, PA-C   metroNIDAZOLE (FLAGYL) 500 MG tablet Take 1 tablet (500 mg total) by mouth 2 (two) times daily. 14 tablet Lynden Oxford Scales, PA-C   clindamycin (CLEOCIN) 2 % vaginal cream Place 1 Applicatorful vaginally at bedtime for 7 days. 40 g Lynden Oxford Scales, PA-C      PDMP not reviewed this encounter.   Lynden Oxford Scales, PA-C 08/04/21 1701

## 2021-08-04 NOTE — ED Triage Notes (Signed)
Pt reports possibly having BV, she reports having odor and discharge.   Started: a few days ago

## 2021-08-05 ENCOUNTER — Other Ambulatory Visit: Payer: Self-pay

## 2021-08-05 ENCOUNTER — Telehealth: Payer: Self-pay

## 2021-08-05 MED ORDER — CYCLOBENZAPRINE HCL 10 MG PO TABS: 10.0000 mg | ORAL_TABLET | Freq: Three times a day (TID) | ORAL | 0 refills | Status: AC | PRN

## 2021-08-05 NOTE — Telephone Encounter (Signed)
Patient LVM on nurse line stating she was seen at urgent care for BV, however the medications are not covered by her insurance. Patient was told by UC to contact her PCP for medication changes. I called her pharmacy and Clindamycin is not covered, however Flagyl is and is ready for pick up.   Please see below for alternative Clindamycin.

## 2021-08-05 NOTE — Telephone Encounter (Signed)
Sorry not sure if you could see the chart. I didn't realize it was so small.

## 2021-08-08 LAB — CERVICOVAGINAL ANCILLARY ONLY
Bacterial Vaginitis (gardnerella): POSITIVE — AB
Candida Glabrata: NEGATIVE
Candida Vaginitis: NEGATIVE
Chlamydia: NEGATIVE
Comment: NEGATIVE
Comment: NEGATIVE
Comment: NEGATIVE
Comment: NEGATIVE
Comment: NEGATIVE
Comment: NORMAL
Neisseria Gonorrhea: NEGATIVE
Trichomonas: NEGATIVE

## 2021-08-09 ENCOUNTER — Other Ambulatory Visit: Payer: Self-pay | Admitting: Family Medicine

## 2021-08-09 MED ORDER — METRONIDAZOLE 0.75 % VA GEL: 1.0000 | Freq: Two times a day (BID) | VAGINAL | 0 refills | Status: AC

## 2021-08-09 NOTE — Telephone Encounter (Signed)
Page I cant read those medications, they are too small. I have sent metrogel to the pharmacy. I hope this is covered by the insurance. Please let me know if not! Thanks

## 2021-08-10 NOTE — Telephone Encounter (Signed)
Sorry, I have no idea why it is so small. Metrogel should be covered by medicaid. Thank you!

## 2022-05-12 IMAGING — MR MR LUMBAR SPINE W/O CM
4 of 5 series · 19 of 48 positions shown · non-contrast
Comparison: None.

CLINICAL DATA: Low back pain.

EXAM:
MRI LUMBAR SPINE WITHOUT CONTRAST
TECHNIQUE: Multiplanar, multisequence MR imaging of the lumbar spine was
performed. No intravenous contrast was administered.

[Series 3: T2 · sagittal · 4.0mm · 0.55mm/px · 7 of 15 slices shown (1 of 2)]
[im 1/15]
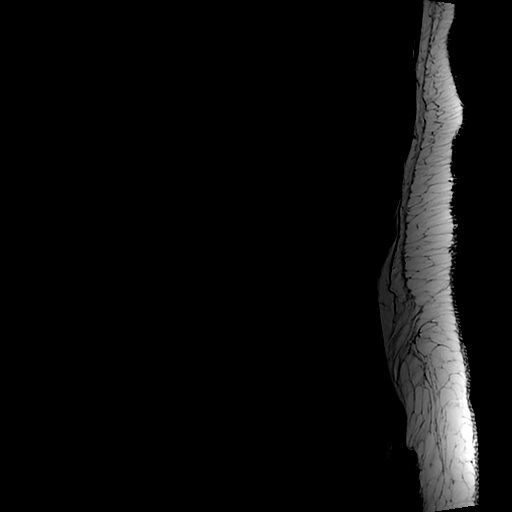
[im 3/15]
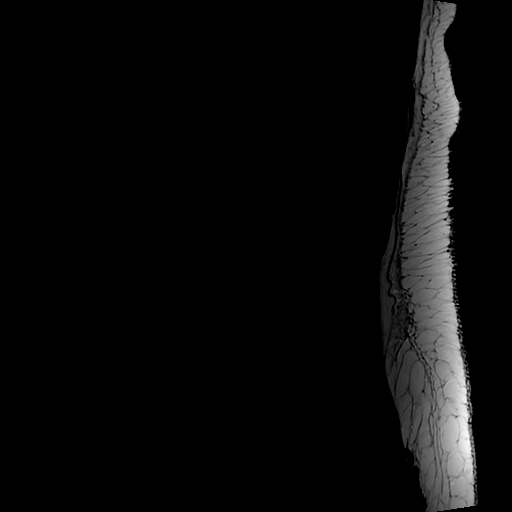
[im 5/15]
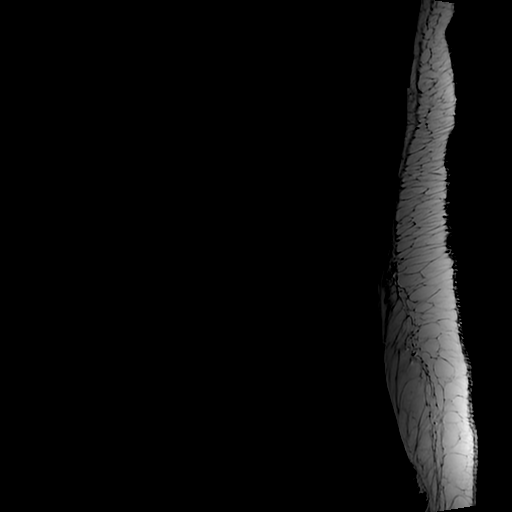
[im 8/15]
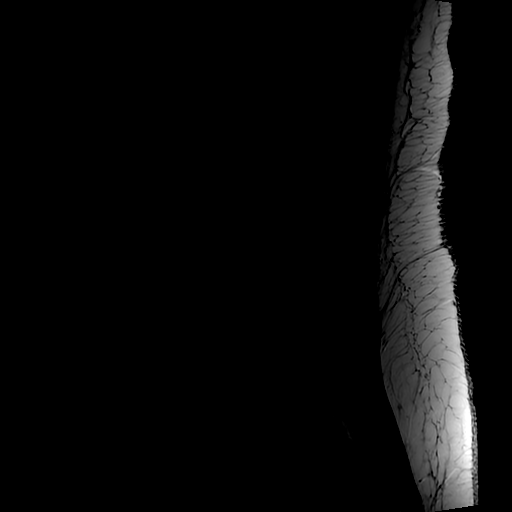
[im 10/15]
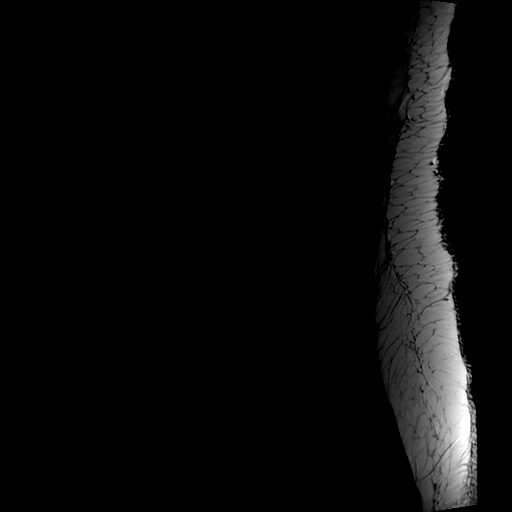
[im 12/15]
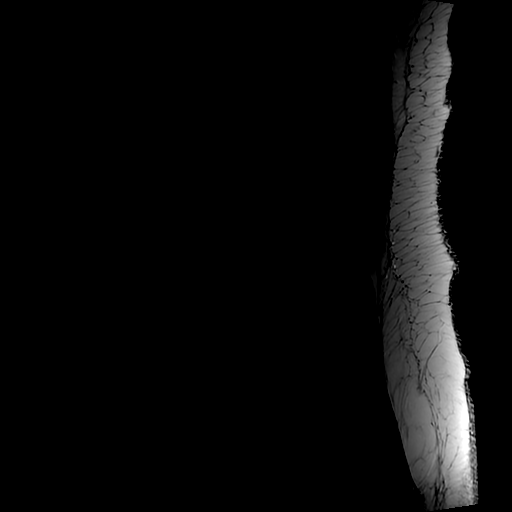
[im 15/15]
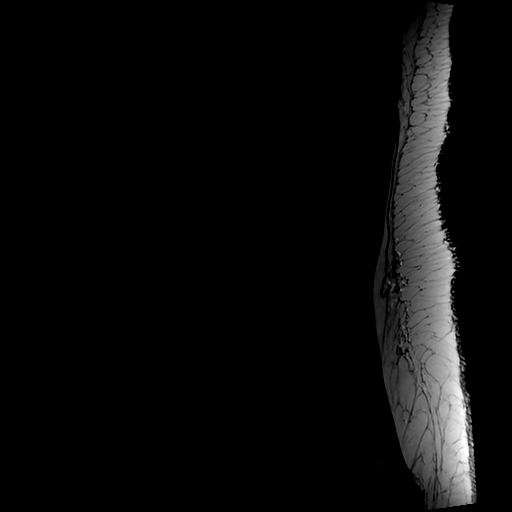

[Series 6: T2 · axial · 4.0mm · 0.39mm/px · z∈[-31,+121]mm · 6 of 34 slices shown (2 of 2)]
[im 1/34]
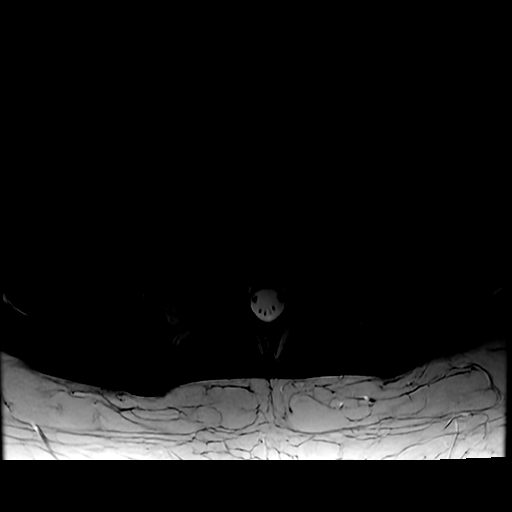
[im 6/34]
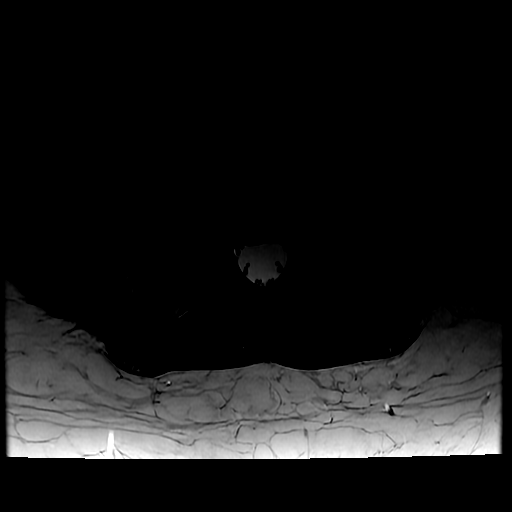
[im 11/34]
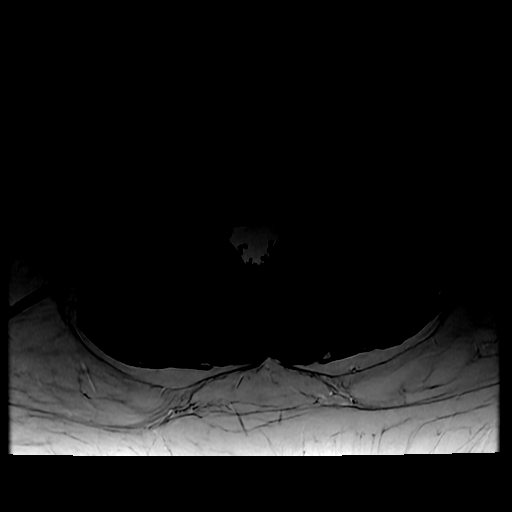
[im 16/34]
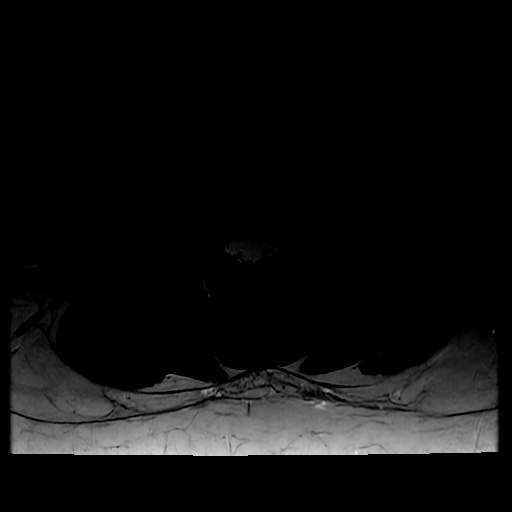
[im 18/34]
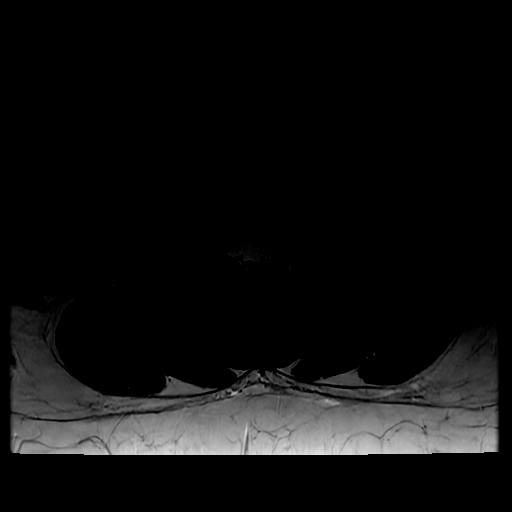
[im 28/34]
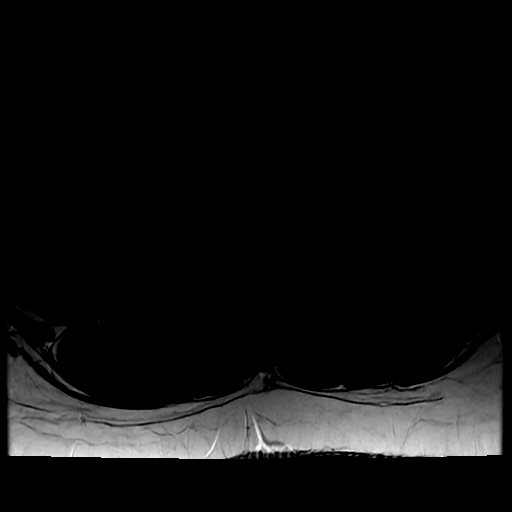

[Series 7: T1 · axial · 4.0mm · 0.39mm/px · z∈[-6,+121]mm · 3 of 34 slices shown (1 of 2)]
[im 6/34]
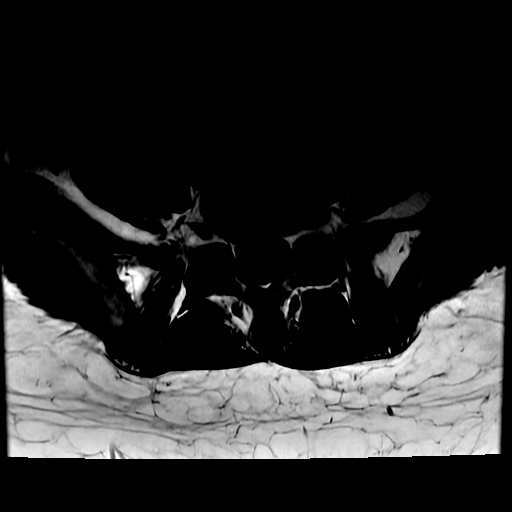
[im 18/34]
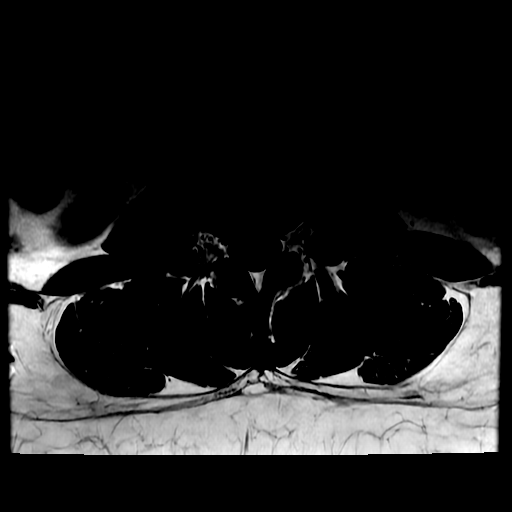
[im 28/34]
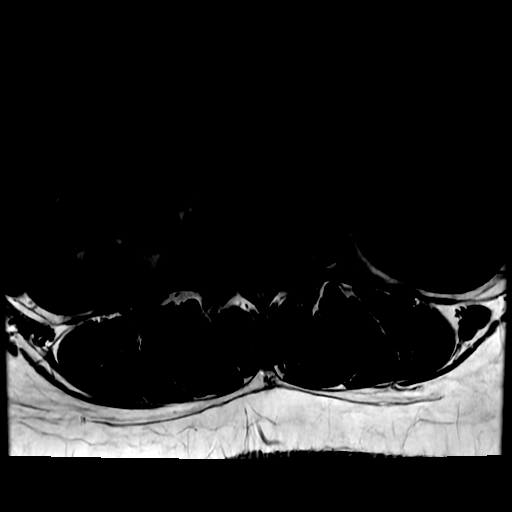

[Series 8: T1 · sagittal · 4.0mm · 0.55mm/px · 3 of 15 slices shown (2 of 2)]
[im 3/15]
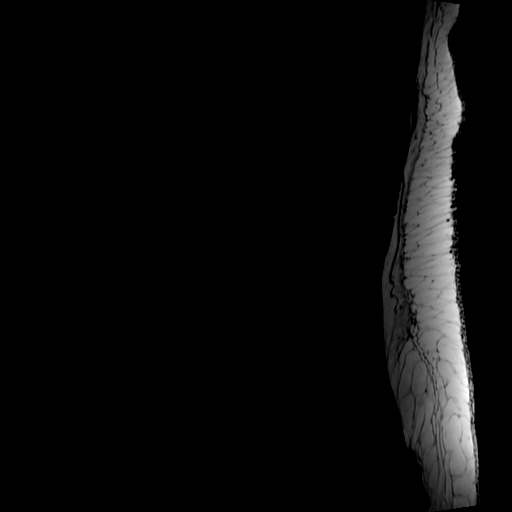
[im 9/15]
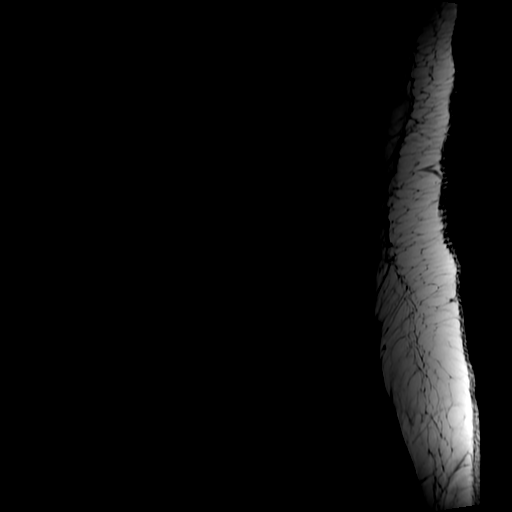
[im 15/15]
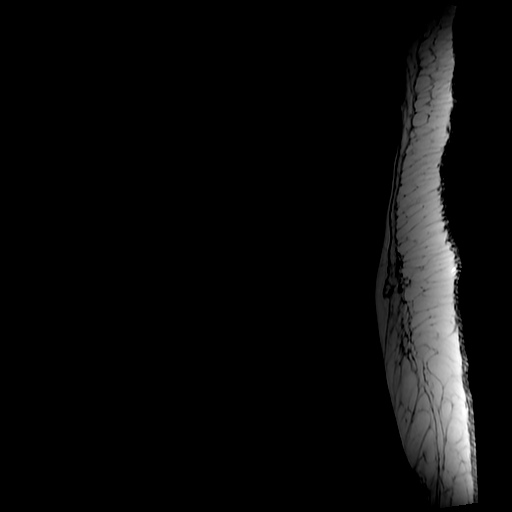

[19 of 48 positions shown; findings below may reference images not displayed]

FINDINGS: Segmentation:  Standard.

Alignment:  Physiologic.

Vertebrae:  No fracture, evidence of discitis, or bone lesion.

Conus medullaris and cauda equina: Conus extends to the L2 level.
Conus and cauda equina appear normal.

Paraspinal and other soft tissues: Negative.

Disc levels:

T12-L1:No spinal canal or neural foraminal stenosis.

L1-2:No spinal canal or neural foraminal stenosis.

L2-3:No spinal canal or neural foraminal stenosis.

L3-4:No spinal canal or neural foraminal stenosis.

L4-5:No spinal canal or neural foraminal stenosis.

L5-S1:Tiny right central disc protrusion with associated annular
tear, causing mild narrowing of the right subarticular zones and
abutting the traversing right S1 nerve root without displacement.
Mild facet degenerative changes. No significant spinal canal or
neural foraminal stenosis.
IMPRESSION: 1. Tiny right central disc protrusion at L5-S1, causing mild
narrowing of the right subarticular zones and abutting the
traversing right S1 nerve root without displacement.
2. No significant spinal canal or neural foraminal stenosis at any
level.
